# Patient Record
Sex: Female | Born: 1970
Health system: Southern US, Community
[De-identification: ages and names within clinical notes are randomized; demographics above are authoritative.]

## PROBLEM LIST (undated history)

## (undated) DIAGNOSIS — Z8619 Personal history of other infectious and parasitic diseases: Secondary | ICD-10-CM

## (undated) DIAGNOSIS — G4730 Sleep apnea, unspecified: Secondary | ICD-10-CM

## (undated) DIAGNOSIS — M1990 Unspecified osteoarthritis, unspecified site: Secondary | ICD-10-CM

## (undated) DIAGNOSIS — L309 Dermatitis, unspecified: Secondary | ICD-10-CM

## (undated) DIAGNOSIS — I10 Essential (primary) hypertension: Secondary | ICD-10-CM

## (undated) HISTORY — DX: Dermatitis, unspecified: L30.9

## (undated) HISTORY — DX: Sleep apnea, unspecified: G47.30

## (undated) HISTORY — DX: Personal history of other infectious and parasitic diseases: Z86.19

## (undated) HISTORY — DX: Unspecified osteoarthritis, unspecified site: M19.90

## (undated) HISTORY — DX: Essential (primary) hypertension: I10

---

## 2005-06-13 ENCOUNTER — Other Ambulatory Visit: Admission: RE | Admit: 2005-06-13 | Discharge: 2005-06-13 | Payer: Self-pay | Admitting: Gynecology

## 2007-05-08 HISTORY — PX: CHOLECYSTECTOMY: SHX55

## 2007-05-08 HISTORY — PX: COLONOSCOPY: SHX174

## 2012-05-07 DIAGNOSIS — L309 Dermatitis, unspecified: Secondary | ICD-10-CM

## 2012-05-07 HISTORY — DX: Dermatitis, unspecified: L30.9

## 2013-02-18 ENCOUNTER — Emergency Department (HOSPITAL_BASED_OUTPATIENT_CLINIC_OR_DEPARTMENT_OTHER)
Admission: EM | Admit: 2013-02-18 | Discharge: 2013-02-18 | Disposition: A | Discharge status: Home / Self Care | Payer: Self-pay | Attending: Emergency Medicine | Admitting: Emergency Medicine

## 2013-02-18 ENCOUNTER — Encounter (HOSPITAL_BASED_OUTPATIENT_CLINIC_OR_DEPARTMENT_OTHER): Payer: Self-pay | Admitting: Emergency Medicine

## 2013-02-18 DIAGNOSIS — L03039 Cellulitis of unspecified toe: Secondary | ICD-10-CM | POA: Insufficient documentation

## 2013-02-18 DIAGNOSIS — IMO0002 Reserved for concepts with insufficient information to code with codable children: Secondary | ICD-10-CM | POA: Insufficient documentation

## 2013-02-18 DIAGNOSIS — L309 Dermatitis, unspecified: Secondary | ICD-10-CM

## 2013-02-18 DIAGNOSIS — L259 Unspecified contact dermatitis, unspecified cause: Secondary | ICD-10-CM | POA: Insufficient documentation

## 2013-02-18 DIAGNOSIS — L02619 Cutaneous abscess of unspecified foot: Secondary | ICD-10-CM | POA: Insufficient documentation

## 2013-02-18 DIAGNOSIS — L0390 Cellulitis, unspecified: Secondary | ICD-10-CM

## 2013-02-18 MED ORDER — HYDROXYZINE HCL 25 MG PO TABS: 25.0000 mg | ORAL_TABLET | Freq: Four times a day (QID) | ORAL | Status: DC | PRN

## 2013-02-18 MED ORDER — PREDNISONE 20 MG PO TABS: ORAL_TABLET | ORAL | Status: DC

## 2013-02-18 MED ORDER — CEPHALEXIN 250 MG PO CAPS
500.0000 mg | ORAL_CAPSULE | Freq: Once | ORAL | Status: AC
  Administered 2013-02-18: 500 mg via ORAL
  Filled 2013-02-18: qty 2

## 2013-02-18 MED ORDER — CEPHALEXIN 500 MG PO CAPS: 500.0000 mg | ORAL_CAPSULE | Freq: Four times a day (QID) | ORAL | Status: DC

## 2013-02-18 MED ORDER — PREDNISONE 50 MG PO TABS
60.0000 mg | ORAL_TABLET | Freq: Once | ORAL | Status: AC
  Administered 2013-02-18: 20:00:00 60 mg via ORAL
  Filled 2013-02-18 (×2): qty 1

## 2013-02-18 NOTE — ED Notes (Signed)
Pt has red, raised rash to arms, feet, legs, and torso with itching and scabbing.

## 2013-02-18 NOTE — ED Provider Notes (Signed)
CSN: [id number]     Arrival date & time 02/18/13  1910 History   First MD Initiated Contact with Patient 02/18/13 1919     Chief Complaint  Patient presents with  . Rash   (Consider location/radiation/quality/duration/timing/severity/associated sxs/prior Treatment) Patient is a 42 y.o. female presenting with rash.  Rash  Pt with no significant PMH reports she has had mild dry, flaky rash on palms of hands for the last several weeks, but in the last 2 weeks has spread to include soles of her feet, wrists, elbows, legs and arms. Some on her chest, but mostly spares her truck. She reports severe itching, not improved with benadryl, has been using peroxide to keep it clean. No fever but today has not felt well.    History reviewed. No pertinent past medical history. Past Surgical History  Procedure Laterality Date  . Cholecystectomy     History reviewed. No pertinent family history. History  Substance Use Topics  . Smoking status: Never Smoker   . Smokeless tobacco: Not on file  . Alcohol Use: No   OB History   Grav Para Term Preterm Abortions TAB SAB Ect Mult Living                 Review of Systems  Skin: Positive for rash.   All other systems reviewed and are negative except as noted in HPI.   Allergies  Review of patient's allergies indicates no known allergies.  Home Medications  No current outpatient prescriptions on file. BP 157/94  Pulse 98  Temp(Src) 98.7 F (37.1 C) (Oral)  Resp 20  SpO2 100% Physical Exam  Nursing note and vitals reviewed. Constitutional: She is oriented to person, place, and time. She appears well-developed and well-nourished.  HENT:  Head: Normocephalic and atraumatic.  Eyes: EOM are normal. Pupils are equal, round, and reactive to light.  Neck: Normal range of motion. Neck supple.  Cardiovascular: Normal rate, normal heart sounds and intact distal pulses.   Pulmonary/Chest: Effort normal and breath sounds normal.  Abdominal: Bowel  sounds are normal. She exhibits no distension. There is no tenderness.  Musculoskeletal: Normal range of motion. She exhibits no edema and no tenderness.  Neurological: She is alert and oriented to person, place, and time. She has normal strength. No cranial nerve deficit or sensory deficit.  Skin: Skin is warm and dry. Rash noted.  There are multiple areas of red scaly rash on palms of hands, soles of feet, toes, fingers, posterior elbows as well as antecubital and popliteal spaces; does not fluoresce under Wood's lamp  Psychiatric: She has a normal mood and affect.    ED Course  Procedures (including critical care time) Labs Review Labs Reviewed - No data to display Imaging Review No results found.  EKG Interpretation   None       MDM    Pt with itchy rash that has features of both psoriasis as well as eczema. She has had eczema before but not to this degree. She likely has some secondary cellulitis as well, particularly on toes and arms. She has appointment with Dermatology scheduled in 9 days. Will start oral prednisone, Vistaril for itching and Keflex for cellulitis.    Charles B. Bernette Mayers, MD 02/18/13 1946

## 2013-07-31 ENCOUNTER — Emergency Department (HOSPITAL_BASED_OUTPATIENT_CLINIC_OR_DEPARTMENT_OTHER)
Admission: EM | Admit: 2013-07-31 | Discharge: 2013-07-31 | Disposition: A | Discharge status: Home / Self Care | Payer: Self-pay | Attending: Emergency Medicine | Admitting: Emergency Medicine

## 2013-07-31 ENCOUNTER — Encounter (HOSPITAL_BASED_OUTPATIENT_CLINIC_OR_DEPARTMENT_OTHER): Payer: Self-pay | Admitting: Emergency Medicine

## 2013-07-31 DIAGNOSIS — Z792 Long term (current) use of antibiotics: Secondary | ICD-10-CM | POA: Insufficient documentation

## 2013-07-31 DIAGNOSIS — R21 Rash and other nonspecific skin eruption: Secondary | ICD-10-CM | POA: Insufficient documentation

## 2013-07-31 MED ORDER — PREDNISONE 10 MG PO TABS: 20.0000 mg | ORAL_TABLET | Freq: Every day | ORAL | Status: DC

## 2013-07-31 NOTE — ED Provider Notes (Signed)
CSN: [id number]     Arrival date & time 07/31/13  0957 History   First MD Initiated Contact with Patient 07/31/13 1017     Chief Complaint  Patient presents with  . Rash     (Consider location/radiation/quality/duration/timing/severity/associated sxs/prior Treatment) Patient is a 43 y.o. female presenting with rash. The history is provided by the patient.  Rash  patient here after being sent home from work and do to a chronic rash that she has had since November of last year. She notes that for the past 2 weeks she has noted increased pruritus that spread to her extremities. She is scheduled to see a dermatologist in 2 weeks. Denies any fever. Has used over-the-counter medications without relief. She is unsure of environmental exposures. Symptoms are persistent. She is also noted mild swelling without change in her urination. Denies any involvement within her oral mucosa. No involvement of her face. Rash started on her hands.  History reviewed. No pertinent past medical history. Past Surgical History  Procedure Laterality Date  . Cholecystectomy     History reviewed. No pertinent family history. History  Substance Use Topics  . Smoking status: Never Smoker   . Smokeless tobacco: Not on file  . Alcohol Use: No   OB History   Grav Para Term Preterm Abortions TAB SAB Ect Mult Living                 Review of Systems  Skin: Positive for rash.  All other systems reviewed and are negative.      Allergies  Review of patient's allergies indicates no known allergies.  Home Medications   Current Outpatient Rx  Name  Route  Sig  Dispense  Refill  . cephALEXin (KEFLEX) 500 MG capsule   Oral   Take 1 capsule (500 mg total) by mouth 4 (four) times daily.   28 capsule   0   . hydrOXYzine (ATARAX/VISTARIL) 25 MG tablet   Oral   Take 1 tablet (25 mg total) by mouth every 6 (six) hours as needed for itching.   12 tablet   0   . predniSONE (DELTASONE) 20 MG tablet      3 Tabs  PO Days 1-3, then 2 tabs PO Days 4-6, then 1 tab PO Day 7-9, then Half Tab PO Day 10-12   20 tablet   0    BP 156/101  Pulse 96  Temp(Src) 98.3 F (36.8 C) (Oral)  Resp 20  Ht 5\' 4"  (1.626 m)  Wt 205 lb (92.987 kg)  BMI 35.17 kg/m2  SpO2 100% Physical Exam  Nursing note and vitals reviewed. Constitutional: She is oriented to person, place, and time. She appears well-developed and well-nourished.  Non-toxic appearance. No distress.  HENT:  Head: Normocephalic and atraumatic.  Eyes: Conjunctivae, EOM and lids are normal. Pupils are equal, round, and reactive to light.  Neck: Normal range of motion. Neck supple. No tracheal deviation present. No mass present.  Cardiovascular: Normal rate, regular rhythm and normal heart sounds.  Exam reveals no gallop.   No murmur heard. Pulmonary/Chest: Effort normal and breath sounds normal. No stridor. No respiratory distress. She has no decreased breath sounds. She has no wheezes. She has no rhonchi. She has no rales.  Abdominal: Soft. Normal appearance and bowel sounds are normal. She exhibits no distension. There is no tenderness. There is no rebound and no CVA tenderness.  Musculoskeletal: Normal range of motion. She exhibits no edema and no tenderness.  Neurological: She is  alert and oriented to person, place, and time. She has normal strength. No cranial nerve deficit or sensory deficit. GCS eye subscore is 4. GCS verbal subscore is 5. GCS motor subscore is 6.  Skin: Skin is warm and dry. Rash noted. No abrasion noted.  Diffuse eczematous rash noted on her hands as well as her extremities. No evidence of superinfection. Rash is macular in some areas. No oral mucosal involvement. Lips are normal. No petechiae. No purpura.  Psychiatric: She has a normal mood and affect. Her speech is normal and behavior is normal.    ED Course  Procedures (including critical care time) Labs Review Labs Reviewed - No data to display Imaging Review No results  found.   EKG Interpretation None      MDM   Final diagnoses:  None    Patient with likely contact dermatitis versus eczema. Will place patient on course of prednisone since she was on the last time it worked in she was encouraged to keep her appointment with her dermatologist    Toy Baker, MD 07/31/13 1032

## 2013-07-31 NOTE — ED Notes (Signed)
Pt states she has been dealing with what she thinks is eczema since November after moving here . It took her until April to get appt with dermatologist . Pt states in the past few days she has been getting much worse and has severe itching with it.

## 2013-07-31 NOTE — Discharge Instructions (Signed)

## 2014-07-19 ENCOUNTER — Telehealth: Payer: Self-pay | Admitting: *Deleted

## 2014-07-19 NOTE — Telephone Encounter (Signed)
Patient unavailable at time of Pre-Visit Call Left message for patient to return call when available.   

## 2014-07-20 ENCOUNTER — Encounter: Payer: Self-pay | Admitting: *Deleted

## 2014-07-20 ENCOUNTER — Telehealth: Payer: Self-pay | Admitting: *Deleted

## 2014-07-20 NOTE — Telephone Encounter (Signed)
Pre-Visit Call completed with patient and chart updated.   Pre-Visit Info documented in Specialty Comments under [employer].    

## 2014-07-21 ENCOUNTER — Ambulatory Visit (INDEPENDENT_AMBULATORY_CARE_PROVIDER_SITE_OTHER): Payer: BLUE CROSS/BLUE SHIELD | Admitting: Physician Assistant

## 2014-07-21 ENCOUNTER — Encounter: Payer: Self-pay | Admitting: Physician Assistant

## 2014-07-21 VITALS — BP 128/86 | HR 87 | Temp 98.2°F | Resp 16 | Ht 64.0 in | Wt 226.5 lb

## 2014-07-21 DIAGNOSIS — Z1239 Encounter for other screening for malignant neoplasm of breast: Secondary | ICD-10-CM

## 2014-07-21 DIAGNOSIS — R21 Rash and other nonspecific skin eruption: Secondary | ICD-10-CM | POA: Insufficient documentation

## 2014-07-21 DIAGNOSIS — E041 Nontoxic single thyroid nodule: Secondary | ICD-10-CM | POA: Insufficient documentation

## 2014-07-21 LAB — CBC WITH DIFFERENTIAL/PLATELET
BASOS ABS: 0 10*3/uL (ref 0.0–0.1)
Basophils Relative: 0.3 % (ref 0.0–3.0)
EOS ABS: 0.3 10*3/uL (ref 0.0–0.7)
Eosinophils Relative: 3 % (ref 0.0–5.0)
HCT: 40.5 % (ref 36.0–46.0)
HEMOGLOBIN: 13.6 g/dL (ref 12.0–15.0)
LYMPHS PCT: 24.2 % (ref 12.0–46.0)
Lymphs Abs: 2.3 10*3/uL (ref 0.7–4.0)
MCHC: 33.4 g/dL (ref 30.0–36.0)
MCV: 85.8 fl (ref 78.0–100.0)
MONO ABS: 0.5 10*3/uL (ref 0.1–1.0)
Monocytes Relative: 5 % (ref 3.0–12.0)
Neutro Abs: 6.3 10*3/uL (ref 1.4–7.7)
Neutrophils Relative %: 67.5 % (ref 43.0–77.0)
PLATELETS: 322 10*3/uL (ref 150.0–400.0)
RBC: 4.72 Mil/uL (ref 3.87–5.11)
RDW: 14.1 % (ref 11.5–15.5)
WBC: 9.4 10*3/uL (ref 4.0–10.5)

## 2014-07-21 LAB — BASIC METABOLIC PANEL
BUN: 14 mg/dL (ref 6–23)
CHLORIDE: 104 meq/L (ref 96–112)
CO2: 29 meq/L (ref 19–32)
Calcium: 8.9 mg/dL (ref 8.4–10.5)
Creatinine, Ser: 0.79 mg/dL (ref 0.40–1.20)
GFR: 83.55 mL/min (ref >60.00)
Glucose, Bld: 100 mg/dL — ABNORMAL HIGH (ref 70–99)
Potassium: 3.9 mEq/L (ref 3.5–5.1)
SODIUM: 137 meq/L (ref 135–145)

## 2014-07-21 LAB — SEDIMENTATION RATE: Sed Rate: 16 mm/hr (ref 0–22)

## 2014-07-21 LAB — RHEUMATOID FACTOR: Rhuematoid fact SerPl-aCnc: <10 IU/mL (ref <=14)

## 2014-07-21 NOTE — Progress Notes (Signed)
Pre visit review using our clinic review tool, if applicable. No additional management support is needed unless otherwise documented below in the visit note/SLS  

## 2014-07-21 NOTE — Assessment & Plan Note (Signed)
Nonpalpable on exam. Patient is overdue for repeat imaging. We'll order a thyroid ultrasound to further assess.

## 2014-07-21 NOTE — Patient Instructions (Signed)
Please stop by the lab for blood work. I will call you with the results. I am concerned that your rash is not a typical eczema and bears further evaluation.  Continue your Atarax as directed for age. Follow-up with dermatology as scheduled.  You'll be contacted to schedule an ultrasound of the thyroid to reassess the nodule. You also be contacted to schedule a screening mammogram. Please follow-up with me chart earliest convenience for complete physical exam.  Welcome to [facility]!

## 2014-07-21 NOTE — Assessment & Plan Note (Addendum)
Order for screening mammogram placed.  

## 2014-07-21 NOTE — Progress Notes (Signed)
Patient presents to clinic today to establish care.  Acute Concerns: Patient complains of chronic skin eruption, previously diagnosed as atopic dermatitis. States she has flareups of this issue that landed her in the hospital. Is unhappy with her diagnosis of eczema, and is wondering if she should see a rheumatologist or an allergist. Has never had lab workup to assess autoimmune causes of rash.  Chronic Issues: Thyroid nodule --patient endorses thyroid nodule first noted in 2008. Was previously followed by endocrinology who was monitoring with ultrasound. Last ultrasound in 2014 per patient.  Patient denies palpable mass, dyspnea or dysphagia. Denies history of abnormal thyroid function.  Health Maintenance: Dental -- up-to-date  Vision -- up-to-date  Immunizations -- flu shot up-to-date. Declines tetanus today. Mammogram --overdue. We'll order screening mammogram  Past Medical History  Diagnosis Date  . Eczema 2014  . History of chicken pox     Past Surgical History  Procedure Laterality Date  . Cholecystectomy  2009  . Colonoscopy  2009    Current Outpatient Prescriptions on File Prior to Visit  Medication Sig Dispense Refill  . Halcinonide 0.1 % CREA Apply 1 application topically as needed.    . hydrOXYzine (ATARAX/VISTARIL) 25 MG tablet Take 1 tablet (25 mg total) by mouth every 6 (six) hours as needed for itching. 12 tablet 0   No current facility-administered medications on file prior to visit.    Allergies  Allergen Reactions  . Adhesive [Tape] Rash    Eczema Rash    Family History  Problem Relation Age of Onset  . Aneurysm Maternal Aunt   . Aneurysm Maternal Grandmother   . Arthritis Mother     Living  . Heart attack Father     Living  . Healthy Brother   . Healthy Son   . Eczema Son     History   Social History  . Marital Status: Married    Spouse Name: N/A  . Number of Children: N/A  . Years of Education: N/A   Occupational History  . Not  on file.   Social History Main Topics  . Smoking status: Never Smoker   . Smokeless tobacco: Not on file  . Alcohol Use: No  . Drug Use: No  . Sexual Activity: Not on file   Other Topics Concern  . Not on file   Social History Narrative   ROS Pertinent review of systems are listed in the history of present illness.  BP 128/86 mmHg  Pulse 87  Temp(Src) 98.2 F (36.8 C) (Oral)  Resp 16  Ht 5\' 4"  (1.626 m)  Wt 226 lb 8 oz (102.74 kg)  BMI 38.86 kg/m2  SpO2 99%  LMP 07/07/2014  Physical Exam  Constitutional: She is oriented to person, place, and time and well-developed, well-nourished, and in no distress.  HENT:  Head: Normocephalic and atraumatic.  Eyes: Conjunctivae are normal.  Neck: Neck supple. No thyromegaly present.  Cardiovascular: Normal rate, regular rhythm, normal heart sounds and intact distal pulses.   Pulmonary/Chest: Effort normal and breath sounds normal. No respiratory distress. She has no wheezes. She has no rales. She exhibits no tenderness.  Lymphadenopathy:    She has no cervical adenopathy.  Neurological: She is alert and oriented to person, place, and time. No cranial nerve deficit.  Skin: Skin is warm and dry.  Significant eczematous rash covering torso, upper extremities and lower extremities bilaterally. More concentrated on palmar aspect of hands.  Psychiatric: Affect normal.  Vitals reviewed.  Assessment/Plan: Thyroid nodule Nonpalpable on exam. Patient is overdue for repeat imaging. We'll order a thyroid ultrasound to further assess.   Skin eruption Previously diagnosed as eczema and atopic dermatitis. Intermittent flares. I do think it's reasonable to do a further workup. We'll order CBC with differential, BMP, sedimentation rate and ANA.  We'll likely have to refer to either allergy or rheumatology for further assessment. Patient encouraged to continue her medications prescribed by dermatology. Follow-up with specialist as  scheduled.   Breast cancer screening Order for screening mammogram placed.

## 2014-07-21 NOTE — Assessment & Plan Note (Signed)
Previously diagnosed as eczema and atopic dermatitis. Intermittent flares. I do think it's reasonable to do a further workup. We'll order CBC with differential, BMP, sedimentation rate and ANA.  We'll likely have to refer to either allergy or rheumatology for further assessment. Patient encouraged to continue her medications prescribed by dermatology. Follow-up with specialist as scheduled.

## 2014-07-22 LAB — ANA: Anti Nuclear Antibody(ANA): NEGATIVE

## 2014-07-23 ENCOUNTER — Telehealth: Payer: Self-pay | Admitting: Physician Assistant

## 2014-07-23 DIAGNOSIS — R21 Rash and other nonspecific skin eruption: Secondary | ICD-10-CM

## 2014-07-23 NOTE — Telephone Encounter (Signed)
Referral placed.

## 2014-07-23 NOTE — Telephone Encounter (Signed)
-----   Message from Eustace Quail, RMA sent at 07/23/2014  8:19 AM EDT ----- Pt agrees to see a allergist.

## 2014-07-26 ENCOUNTER — Ambulatory Visit (HOSPITAL_BASED_OUTPATIENT_CLINIC_OR_DEPARTMENT_OTHER)
Admission: RE | Admit: 2014-07-26 | Discharge: 2014-07-26 | Disposition: A | Discharge status: Home / Self Care | Payer: BLUE CROSS/BLUE SHIELD | Source: Ambulatory Visit | Attending: Physician Assistant | Admitting: Physician Assistant

## 2014-07-26 DIAGNOSIS — E041 Nontoxic single thyroid nodule: Secondary | ICD-10-CM

## 2014-07-26 DIAGNOSIS — E042 Nontoxic multinodular goiter: Secondary | ICD-10-CM | POA: Insufficient documentation

## 2014-07-29 NOTE — Progress Notes (Signed)
Spoke with patient and advised per recommendations.   Patient will get her old records for comparison.

## 2014-09-28 ENCOUNTER — Telehealth: Payer: Self-pay | Admitting: Physician Assistant

## 2014-09-28 NOTE — Telephone Encounter (Signed)
Spoke with the pt and informed her that the US showed -Thyroid nodules are small in size. Will need her old records so that I can compare size of nodules now versus last [place]. She can bring this in at her earliest convenience. No concerning findings.  Pt verbalized understanding, and stated that she spoke with someone in imaging and they informed her that her old records could not be found.  Verbally informed Selena Batten of this and he would like to repeat the [place] in 6 mos.  Called and lmom @ 4:53pm @ [phone]) informing the pt of this and asked the pt to give me a call back on Wed to let me know she received the message.//AB/CMA

## 2014-09-28 NOTE — Telephone Encounter (Signed)
Relation to HU:DJSH  Call back number: [phone]   Reason for call:  Pt inquiring about ultra sound results taken 07/26/2014, pt received a bill and does not recall every receiving results. Please advise

## 2014-09-29 ENCOUNTER — Institutional Professional Consult (permissible substitution): Payer: BLUE CROSS/BLUE SHIELD | Admitting: Internal Medicine

## 2014-09-29 NOTE — Telephone Encounter (Signed)
Spoke with the pt and she stated that she received the message and agreed to having the [place] repeated in 6 mos.//AB/CMA

## 2015-05-11 MED FILL — TRIAMCINOLONE 0.1% CREAM: 0.1 | 30 days supply | Qty: 454 | Fill #2

## 2015-05-11 MED FILL — hydrOXYzine HCL 10 MG TABS: 10 | 30 days supply | Qty: 90 | Fill #0

## 2015-05-11 MED FILL — FOLIC ACID 1 MG TABLET: 1 | 30 days supply | Qty: 30 | Fill #3

## 2015-05-11 MED FILL — METHOTREXATE 2.5 MG TABLET: 2.5 | 28 days supply | Qty: 20 | Fill #4

## 2015-05-30 ENCOUNTER — Encounter: Payer: Self-pay | Admitting: *Deleted

## 2015-05-30 ENCOUNTER — Telehealth: Payer: Self-pay | Admitting: *Deleted

## 2015-05-30 NOTE — Telephone Encounter (Signed)
Pre-Visit Call completed with patient and chart updated.   Pre-Visit Info documented in Specialty Comments under [employer].    

## 2015-05-30 NOTE — Telephone Encounter (Signed)
Spoke w/ pt's spouse who stated he would have her call back.

## 2015-05-30 NOTE — Telephone Encounter (Signed)
Opened in error

## 2015-05-30 NOTE — Addendum Note (Signed)
Addended by: Starla Link on: 05/30/2015 03:22 PM   Modules accepted: Medications

## 2015-05-31 ENCOUNTER — Encounter: Payer: Self-pay | Admitting: Physician Assistant

## 2015-05-31 ENCOUNTER — Ambulatory Visit (INDEPENDENT_AMBULATORY_CARE_PROVIDER_SITE_OTHER): Payer: BLUE CROSS/BLUE SHIELD | Admitting: Physician Assistant

## 2015-05-31 VITALS — BP 128/88 | HR 98 | Temp 98.2°F | Ht 64.0 in | Wt 225.2 lb

## 2015-05-31 DIAGNOSIS — M25551 Pain in right hip: Secondary | ICD-10-CM

## 2015-05-31 DIAGNOSIS — Z0000 Encounter for general adult medical examination without abnormal findings: Secondary | ICD-10-CM

## 2015-05-31 DIAGNOSIS — G8929 Other chronic pain: Secondary | ICD-10-CM | POA: Insufficient documentation

## 2015-05-31 DIAGNOSIS — Z1239 Encounter for other screening for malignant neoplasm of breast: Secondary | ICD-10-CM | POA: Diagnosis not present

## 2015-05-31 DIAGNOSIS — M25562 Pain in left knee: Secondary | ICD-10-CM

## 2015-05-31 DIAGNOSIS — M25569 Pain in unspecified knee: Secondary | ICD-10-CM

## 2015-05-31 DIAGNOSIS — M25559 Pain in unspecified hip: Secondary | ICD-10-CM

## 2015-05-31 DIAGNOSIS — M25561 Pain in right knee: Secondary | ICD-10-CM

## 2015-05-31 DIAGNOSIS — M25552 Pain in left hip: Secondary | ICD-10-CM

## 2015-05-31 LAB — LIPID PANEL
CHOLESTEROL: 159 mg/dL (ref 0–200)
HDL: 53.7 mg/dL (ref >39.00)
LDL Cholesterol: 91 mg/dL (ref 0–99)
NonHDL: 105.6
Total CHOL/HDL Ratio: 3
Triglycerides: 71 mg/dL (ref 0.0–149.0)
VLDL: 14.2 mg/dL (ref 0.0–40.0)

## 2015-05-31 LAB — COMPREHENSIVE METABOLIC PANEL
ALK PHOS: 77 U/L (ref 39–117)
ALT: 20 U/L (ref 0–35)
AST: 17 U/L (ref 0–37)
Albumin: 4.1 g/dL (ref 3.5–5.2)
BUN: 12 mg/dL (ref 6–23)
CO2: 29 meq/L (ref 19–32)
Calcium: 8.9 mg/dL (ref 8.4–10.5)
Chloride: 104 mEq/L (ref 96–112)
Creatinine, Ser: 0.7 mg/dL (ref 0.40–1.20)
GFR: 95.7 mL/min (ref >60.00)
Glucose, Bld: 107 mg/dL — ABNORMAL HIGH (ref 70–99)
POTASSIUM: 4.2 meq/L (ref 3.5–5.1)
Sodium: 138 mEq/L (ref 135–145)
Total Bilirubin: 0.5 mg/dL (ref 0.2–1.2)
Total Protein: 7.4 g/dL (ref 6.0–8.3)

## 2015-05-31 LAB — CBC
HEMATOCRIT: 39 % (ref 36.0–46.0)
HEMOGLOBIN: 12.7 g/dL (ref 12.0–15.0)
MCHC: 32.5 g/dL (ref 30.0–36.0)
MCV: 88.6 fl (ref 78.0–100.0)
Platelets: 296 10*3/uL (ref 150.0–400.0)
RBC: 4.4 Mil/uL (ref 3.87–5.11)
RDW: 14.4 % (ref 11.5–15.5)
WBC: 8.4 10*3/uL (ref 4.0–10.5)

## 2015-05-31 LAB — URINALYSIS, ROUTINE W REFLEX MICROSCOPIC
Bilirubin Urine: NEGATIVE
Hgb urine dipstick: NEGATIVE
KETONES UR: NEGATIVE
Leukocytes, UA: NEGATIVE
NITRITE: NEGATIVE
RBC / HPF: NONE SEEN (ref 0–?)
SPECIFIC GRAVITY, URINE: 1.025 (ref 1.000–1.030)
TOTAL PROTEIN, URINE-UPE24: NEGATIVE
URINE GLUCOSE: NEGATIVE
UROBILINOGEN UA: 0.2 (ref 0.0–1.0)
pH: 5.5 (ref 5.0–8.0)

## 2015-05-31 LAB — HEMOGLOBIN A1C: Hgb A1c MFr Bld: 5.6 % (ref 4.6–6.5)

## 2015-05-31 LAB — VITAMIN D 25 HYDROXY (VIT D DEFICIENCY, FRACTURES): VITD: 16.44 ng/mL — AB (ref 30.00–100.00)

## 2015-05-31 LAB — TSH: TSH: 1.41 u[IU]/mL (ref 0.35–4.50)

## 2015-05-31 NOTE — Assessment & Plan Note (Signed)
Order for screening mammogram placed.  

## 2015-05-31 NOTE — Progress Notes (Signed)
Pre visit review using our clinic review tool, if applicable. No additional management support is needed unless otherwise documented below in the visit note. 

## 2015-05-31 NOTE — Assessment & Plan Note (Signed)
Discussed ambulation while at work and daily exercise. Light stretches recommended. Tylenol arthritis daily. Follow-up if symptoms are not improving.

## 2015-05-31 NOTE — Assessment & Plan Note (Signed)
Depression screen negative. Health Maintenance reviewed -- Flu shot up-to-date. PAP up-to-date. Will schedule screening mammogram. Is going to check on TDaP administration. Feels she had at work. Preventive schedule discussed and handout given in AVS. Will obtain fasting labs today.

## 2015-05-31 NOTE — Patient Instructions (Addendum)
Please go to the lab for blood work. I will call with your results. You will be contacted to schedule a screening mammogram.   Please try to get the desk you were mentioning so that you can stand more at work, as this helps your symptoms. Try to walk daily after work. Do some light stretches. Take a tylenol Arthritis daily before work with food. Let me know if symptoms are not improving.  Preventive Care for Adults, Female A healthy lifestyle and preventive care can promote health and wellness. Preventive health guidelines for women include the following key practices.  A routine yearly physical is a good way to check with your health care provider about your health and preventive screening. It is a chance to share any concerns and updates on your health and to receive a thorough exam.  Visit your dentist for a routine exam and preventive care every 6 months. Brush your teeth twice a day and floss once a day. Good oral hygiene prevents tooth decay and gum disease.  The frequency of eye exams is based on your age, health, family medical history, use of contact lenses, and other factors. Follow your health care provider's recommendations for frequency of eye exams.  Eat a healthy diet. Foods like vegetables, fruits, whole grains, low-fat dairy products, and lean protein foods contain the nutrients you need without too many calories. Decrease your intake of foods high in solid fats, added sugars, and salt. Eat the right amount of calories for you.Get information about a proper diet from your health care provider, if necessary.  Regular physical exercise is one of the most important things you can do for your health. Most adults should get at least 150 minutes of moderate-intensity exercise (any activity that increases your heart rate and causes you to sweat) each week. In addition, most adults need muscle-strengthening exercises on 2 or more days a week.  Maintain a healthy weight. The body mass  index (BMI) is a screening tool to identify possible weight problems. It provides an estimate of body fat based on height and weight. Your health care provider can find your BMI and can help you achieve or maintain a healthy weight.For adults 20 years and older:  A BMI below 18.5 is considered underweight.  A BMI of 18.5 to 24.9 is normal.  A BMI of 25 to 29.9 is considered overweight.  A BMI of 30 and above is considered obese.  Maintain normal blood lipids and cholesterol levels by exercising and minimizing your intake of saturated fat. Eat a balanced diet with plenty of fruit and vegetables. Blood tests for lipids and cholesterol should begin at age 19 and be repeated every 5 years. If your lipid or cholesterol levels are high, you are over 50, or you are at high risk for heart disease, you may need your cholesterol levels checked more frequently.Ongoing high lipid and cholesterol levels should be treated with medicines if diet and exercise are not working.  If you smoke, find out from your health care provider how to quit. If you do not use tobacco, do not start.  Lung cancer screening is recommended for adults aged 53-80 years who are at high risk for developing lung cancer because of a history of smoking. A yearly low-dose CT scan of the lungs is recommended for people who have at least a 30-pack-year history of smoking and are a current smoker or have quit within the past 15 years. A pack year of smoking is smoking an average  of 1 pack of cigarettes a day for 1 year (for example: 1 pack a day for 30 years or 2 packs a day for 15 years). Yearly screening should continue until the smoker has stopped smoking for at least 15 years. Yearly screening should be stopped for people who develop a health problem that would prevent them from having lung cancer treatment.  If you are pregnant, do not drink alcohol. If you are breastfeeding, be very cautious about drinking alcohol. If you are not pregnant  and choose to drink alcohol, do not have more than 1 drink per day. One drink is considered to be 12 ounces (355 mL) of beer, 5 ounces (148 mL) of wine, or 1.5 ounces (44 mL) of liquor.  Avoid use of street drugs. Do not share needles with anyone. Ask for help if you need support or instructions about stopping the use of drugs.  High blood pressure causes heart disease and increases the risk of stroke. Your blood pressure should be checked at least every 1 to 2 years. Ongoing high blood pressure should be treated with medicines if weight loss and exercise do not work.  If you are 49-63 years old, ask your health care provider if you should take aspirin to prevent strokes.  Diabetes screening is done by taking a blood sample to check your blood glucose level after you have not eaten for a certain period of time (fasting). If you are not overweight and you do not have risk factors for diabetes, you should be screened once every 3 years starting at age 101. If you are overweight or obese and you are 20-95 years of age, you should be screened for diabetes every year as part of your cardiovascular risk assessment.  Breast cancer screening is essential preventive care for women. You should practice "breast self-awareness." This means understanding the normal appearance and feel of your breasts and may include breast self-examination. Any changes detected, no matter how small, should be reported to a health care provider. Women in their 72s and 30s should have a clinical breast exam (CBE) by a health care provider as part of a regular health exam every 1 to 3 years. After age 22, women should have a CBE every year. Starting at age 18, women should consider having a mammogram (breast X-ray test) every year. Women who have a family history of breast cancer should talk to their health care provider about genetic screening. Women at a high risk of breast cancer should talk to their health care providers about having  an MRI and a mammogram every year.  Breast cancer gene (BRCA)-related cancer risk assessment is recommended for women who have family members with BRCA-related cancers. BRCA-related cancers include breast, ovarian, tubal, and peritoneal cancers. Having family members with these cancers may be associated with an increased risk for harmful changes (mutations) in the breast cancer genes BRCA1 and BRCA2. Results of the assessment will determine the need for genetic counseling and BRCA1 and BRCA2 testing.  Your health care provider may recommend that you be screened regularly for cancer of the pelvic organs (ovaries, uterus, and vagina). This screening involves a pelvic examination, including checking for microscopic changes to the surface of your cervix (Pap test). You may be encouraged to have this screening done every 3 years, beginning at age 3.  For women ages 62-65, health care providers may recommend pelvic exams and Pap testing every 3 years, or they may recommend the Pap and pelvic exam, combined with testing  for human papilloma virus (HPV), every 5 years. Some types of HPV increase your risk of cervical cancer. Testing for HPV may also be done on women of any age with unclear Pap test results.  Other health care providers may not recommend any screening for nonpregnant women who are considered low risk for pelvic cancer and who do not have symptoms. Ask your health care provider if a screening pelvic exam is right for you.  If you have had past treatment for cervical cancer or a condition that could lead to cancer, you need Pap tests and screening for cancer for at least 20 years after your treatment. If Pap tests have been discontinued, your risk factors (such as having a new sexual partner) need to be reassessed to determine if screening should resume. Some women have medical problems that increase the chance of getting cervical cancer. In these cases, your health care provider may recommend more  frequent screening and Pap tests.  Colorectal cancer can be detected and often prevented. Most routine colorectal cancer screening begins at the age of 36 years and continues through age 38 years. However, your health care provider may recommend screening at an earlier age if you have risk factors for colon cancer. On a yearly basis, your health care provider may provide home test kits to check for hidden blood in the stool. Use of a small camera at the end of a tube, to directly examine the colon (sigmoidoscopy or colonoscopy), can detect the earliest forms of colorectal cancer. Talk to your health care provider about this at age 62, when routine screening begins. Direct exam of the colon should be repeated every 5-10 years through age 82 years, unless early forms of precancerous polyps or small growths are found.  People who are at an increased risk for hepatitis B should be screened for this virus. You are considered at high risk for hepatitis B if:  You were born in a country where hepatitis B occurs often. Talk with your health care provider about which countries are considered high risk.  Your parents were born in a high-risk country and you have not received a shot to protect against hepatitis B (hepatitis B vaccine).  You have HIV or AIDS.  You use needles to inject street drugs.  You live with, or have sex with, someone who has hepatitis B.  You get hemodialysis treatment.  You take certain medicines for conditions like cancer, organ transplantation, and autoimmune conditions.  Hepatitis C blood testing is recommended for all people born from [birth year] through 1965 and any individual with known risks for hepatitis C.  Practice safe sex. Use condoms and avoid high-risk sexual practices to reduce the spread of sexually transmitted infections (STIs). STIs include gonorrhea, chlamydia, syphilis, trichomonas, herpes, HPV, and human immunodeficiency virus (HIV). Herpes, HIV, and HPV are viral  illnesses that have no cure. They can result in disability, cancer, and death.  You should be screened for sexually transmitted illnesses (STIs) including gonorrhea and chlamydia if:  You are sexually active and are younger than 24 years.  You are older than 24 years and your health care provider tells you that you are at risk for this type of infection.  Your sexual activity has changed since you were last screened and you are at an increased risk for chlamydia or gonorrhea. Ask your health care provider if you are at risk.  If you are at risk of being infected with HIV, it is recommended that you take a  prescription medicine daily to prevent HIV infection. This is called preexposure prophylaxis (PrEP). You are considered at risk if:  You are sexually active and do not regularly use condoms or know the HIV status of your partner(s).  You take drugs by injection.  You are sexually active with a partner who has HIV.  Talk with your health care provider about whether you are at high risk of being infected with HIV. If you choose to begin PrEP, you should first be tested for HIV. You should then be tested every 3 months for as long as you are taking PrEP.  Osteoporosis is a disease in which the bones lose minerals and strength with aging. This can result in serious bone fractures or breaks. The risk of osteoporosis can be identified using a bone density scan. Women ages 68 years and over and women at risk for fractures or osteoporosis should discuss screening with their health care providers. Ask your health care provider whether you should take a calcium supplement or vitamin D to reduce the rate of osteoporosis.  Menopause can be associated with physical symptoms and risks. Hormone replacement therapy is available to decrease symptoms and risks. You should talk to your health care provider about whether hormone replacement therapy is right for you.  Use sunscreen. Apply sunscreen liberally and  repeatedly throughout the day. You should seek shade when your shadow is shorter than you. Protect yourself by wearing long sleeves, pants, a wide-brimmed hat, and sunglasses year round, whenever you are outdoors.  Once a month, do a whole body skin exam, using a mirror to look at the skin on your back. Tell your health care provider of new moles, moles that have irregular borders, moles that are larger than a pencil eraser, or moles that have changed in shape or color.  Stay current with required vaccines (immunizations).  Influenza vaccine. All adults should be immunized every year.  Tetanus, diphtheria, and acellular pertussis (Td, Tdap) vaccine. Pregnant women should receive 1 dose of Tdap vaccine during each pregnancy. The dose should be obtained regardless of the length of time since the last dose. Immunization is preferred during the 27th-36th week of gestation. An adult who has not previously received Tdap or who does not know her vaccine status should receive 1 dose of Tdap. This initial dose should be followed by tetanus and diphtheria toxoids (Td) booster doses every 10 years. Adults with an unknown or incomplete history of completing a 3-dose immunization series with Td-containing vaccines should begin or complete a primary immunization series including a Tdap dose. Adults should receive a Td booster every 10 years.  Varicella vaccine. An adult without evidence of immunity to varicella should receive 2 doses or a second dose if she has previously received 1 dose. Pregnant females who do not have evidence of immunity should receive the first dose after pregnancy. This first dose should be obtained before leaving the health care facility. The second dose should be obtained 4-8 weeks after the first dose.  Human papillomavirus (HPV) vaccine. Females aged 13-26 years who have not received the vaccine previously should obtain the 3-dose series. The vaccine is not recommended for use in pregnant  females. However, pregnancy testing is not needed before receiving a dose. If a female is found to be pregnant after receiving a dose, no treatment is needed. In that case, the remaining doses should be delayed until after the pregnancy. Immunization is recommended for any person with an immunocompromised condition through the age of  26 years if she did not get any or all doses earlier. During the 3-dose series, the second dose should be obtained 4-8 weeks after the first dose. The third dose should be obtained 24 weeks after the first dose and 16 weeks after the second dose.  Zoster vaccine. One dose is recommended for adults aged 64 years or older unless certain conditions are present.  Measles, mumps, and rubella (MMR) vaccine. Adults born before [birth year] generally are considered immune to measles and mumps. Adults born in [birth year] or later should have 1 or more doses of MMR vaccine unless there is a contraindication to the vaccine or there is laboratory evidence of immunity to each of the three diseases. A routine second dose of MMR vaccine should be obtained at least 28 days after the first dose for students attending postsecondary schools, health care workers, or international travelers. People who received inactivated measles vaccine or an unknown type of measles vaccine during 1963-1967 should receive 2 doses of MMR vaccine. People who received inactivated mumps vaccine or an unknown type of mumps vaccine before 1979 and are at high risk for mumps infection should consider immunization with 2 doses of MMR vaccine. For females of childbearing age, rubella immunity should be determined. If there is no evidence of immunity, females who are not pregnant should be vaccinated. If there is no evidence of immunity, females who are pregnant should delay immunization until after pregnancy. Unvaccinated health care workers born before [birth year] who lack laboratory evidence of measles, mumps, or rubella immunity or laboratory  confirmation of disease should consider measles and mumps immunization with 2 doses of MMR vaccine or rubella immunization with 1 dose of MMR vaccine.  Pneumococcal 13-valent conjugate (PCV13) vaccine. When indicated, a person who is uncertain of his immunization history and has no record of immunization should receive the PCV13 vaccine. All adults 63 years of age and older should receive this vaccine. An adult aged 19 years or older who has certain medical conditions and has not been previously immunized should receive 1 dose of PCV13 vaccine. This PCV13 should be followed with a dose of pneumococcal polysaccharide (PPSV23) vaccine. Adults who are at high risk for pneumococcal disease should obtain the PPSV23 vaccine at least 8 weeks after the dose of PCV13 vaccine. Adults older than 47 years of age who have normal immune system function should obtain the PPSV23 vaccine dose at least 1 year after the dose of PCV13 vaccine.  Pneumococcal polysaccharide (PPSV23) vaccine. When PCV13 is also indicated, PCV13 should be obtained first. All adults aged 61 years and older should be immunized. An adult younger than age 68 years who has certain medical conditions should be immunized. Any person who resides in a nursing home or long-term care facility should be immunized. An adult smoker should be immunized. People with an immunocompromised condition and certain other conditions should receive both PCV13 and PPSV23 vaccines. People with human immunodeficiency virus (HIV) infection should be immunized as soon as possible after diagnosis. Immunization during chemotherapy or radiation therapy should be avoided. Routine use of PPSV23 vaccine is not recommended for American Indians, [place] Natives, or people younger than 65 years unless there are medical conditions that require PPSV23 vaccine. When indicated, people who have unknown immunization and have no record of immunization should receive PPSV23 vaccine. One-time  revaccination 5 years after the first dose of PPSV23 is recommended for people aged 19-64 years who have chronic kidney failure, nephrotic syndrome, asplenia, or immunocompromised conditions. People who received  1-2 doses of PPSV23 before age 9 years should receive another dose of PPSV23 vaccine at age 25 years or later if at least 5 years have passed since the previous dose. Doses of PPSV23 are not needed for people immunized with PPSV23 at or after age 55 years.  Meningococcal vaccine. Adults with asplenia or persistent complement component deficiencies should receive 2 doses of quadrivalent meningococcal conjugate (MenACWY-D) vaccine. The doses should be obtained at least 2 months apart. Microbiologists working with certain meningococcal bacteria, [place] recruits, people at risk during an outbreak, and people who travel to or live in countries with a high rate of meningitis should be immunized. A first-year college student up through age 31 years who is living in a residence hall should receive a dose if she did not receive a dose on or after her 16th birthday. Adults who have certain high-risk conditions should receive one or more doses of vaccine.  Hepatitis A vaccine. Adults who wish to be protected from this disease, have certain high-risk conditions, work with hepatitis A-infected animals, work in hepatitis A research labs, or travel to or work in countries with a high rate of hepatitis A should be immunized. Adults who were previously unvaccinated and who anticipate close contact with an international adoptee during the first 60 days after arrival in the [place] from a country with a high rate of hepatitis A should be immunized.  Hepatitis B vaccine. Adults who wish to be protected from this disease, have certain high-risk conditions, may be exposed to blood or other infectious body fluids, are household contacts or sex partners of hepatitis B positive people, are clients or workers in  certain care facilities, or travel to or work in countries with a high rate of hepatitis B should be immunized.  Haemophilus influenzae type b (Hib) vaccine. A previously unvaccinated person with asplenia or sickle cell disease or having a scheduled splenectomy should receive 1 dose of Hib vaccine. Regardless of previous immunization, a recipient of a hematopoietic stem cell transplant should receive a 3-dose series 6-12 months after her successful transplant. Hib vaccine is not recommended for adults with HIV infection. Preventive Services / Frequency Ages 41 to 50 years  Blood pressure check.** / Every 3-5 years.  Lipid and cholesterol check.** / Every 5 years beginning at age 35.  Clinical breast exam.** / Every 3 years for women in their 53s and 81s.  BRCA-related cancer risk assessment.** / For women who have family members with a BRCA-related cancer (breast, ovarian, tubal, or peritoneal cancers).  Pap test.** / Every 2 years from ages 63 through 43. Every 3 years starting at age 15 through age 36 or 9 with a history of 3 consecutive normal Pap tests.  HPV screening.** / Every 3 years from ages 61 through ages 83 to 35 with a history of 3 consecutive normal Pap tests.  Hepatitis C blood test.** / For any individual with known risks for hepatitis C.  Skin self-exam. / Monthly.  Influenza vaccine. / Every year.  Tetanus, diphtheria, and acellular pertussis (Tdap, Td) vaccine.** / Consult your health care provider. Pregnant women should receive 1 dose of Tdap vaccine during each pregnancy. 1 dose of Td every 10 years.  Varicella vaccine.** / Consult your health care provider. Pregnant females who do not have evidence of immunity should receive the first dose after pregnancy.  HPV vaccine. / 3 doses over 6 months, if 39 and younger. The vaccine is not recommended for use in pregnant females.  However, pregnancy testing is not needed before receiving a dose.  Measles, mumps, rubella  (MMR) vaccine.** / You need at least 1 dose of MMR if you were born in 1957 or later. You may also need a 2nd dose. For females of childbearing age, rubella immunity should be determined. If there is no evidence of immunity, females who are not pregnant should be vaccinated. If there is no evidence of immunity, females who are pregnant should delay immunization until after pregnancy.  Pneumococcal 13-valent conjugate (PCV13) vaccine.** / Consult your health care provider.  Pneumococcal polysaccharide (PPSV23) vaccine.** / 1 to 2 doses if you smoke cigarettes or if you have certain conditions.  Meningococcal vaccine.** / 1 dose if you are age 22 to 43 years and a Market researcher living in a residence hall, or have one of several medical conditions, you need to get vaccinated against meningococcal disease. You may also need additional booster doses.  Hepatitis A vaccine.** / Consult your health care provider.  Hepatitis B vaccine.** / Consult your health care provider.  Haemophilus influenzae type b (Hib) vaccine.** / Consult your health care provider. Ages 4 to 43 years  Blood pressure check.** / Every year.  Lipid and cholesterol check.** / Every 5 years beginning at age 28 years.  Lung cancer screening. / Every year if you are aged 22-80 years and have a 30-pack-year history of smoking and currently smoke or have quit within the past 15 years. Yearly screening is stopped once you have quit smoking for at least 15 years or develop a health problem that would prevent you from having lung cancer treatment.  Clinical breast exam.** / Every year after age 67 years.  BRCA-related cancer risk assessment.** / For women who have family members with a BRCA-related cancer (breast, ovarian, tubal, or peritoneal cancers).  Mammogram.** / Every year beginning at age 67 years and continuing for as long as you are in good health. Consult with your health care provider.  Pap test.** / Every 3  years starting at age 40 years through age 9 or 41 years with a history of 3 consecutive normal Pap tests.  HPV screening.** / Every 3 years from ages 35 years through ages 3 to 34 years with a history of 3 consecutive normal Pap tests.  Fecal occult blood test (FOBT) of stool. / Every year beginning at age 83 years and continuing until age 58 years. You may not need to do this test if you get a colonoscopy every 10 years.  Flexible sigmoidoscopy or colonoscopy.** / Every 5 years for a flexible sigmoidoscopy or every 10 years for a colonoscopy beginning at age 60 years and continuing until age 31 years.  Hepatitis C blood test.** / For all people born from [birth year] through 1965 and any individual with known risks for hepatitis C.  Skin self-exam. / Monthly.  Influenza vaccine. / Every year.  Tetanus, diphtheria, and acellular pertussis (Tdap/Td) vaccine.** / Consult your health care provider. Pregnant women should receive 1 dose of Tdap vaccine during each pregnancy. 1 dose of Td every 10 years.  Varicella vaccine.** / Consult your health care provider. Pregnant females who do not have evidence of immunity should receive the first dose after pregnancy.  Zoster vaccine.** / 1 dose for adults aged 71 years or older.  Measles, mumps, rubella (MMR) vaccine.** / You need at least 1 dose of MMR if you were born in 1957 or later. You may also need a second dose. For females of  childbearing age, rubella immunity should be determined. If there is no evidence of immunity, females who are not pregnant should be vaccinated. If there is no evidence of immunity, females who are pregnant should delay immunization until after pregnancy.  Pneumococcal 13-valent conjugate (PCV13) vaccine.** / Consult your health care provider.  Pneumococcal polysaccharide (PPSV23) vaccine.** / 1 to 2 doses if you smoke cigarettes or if you have certain conditions.  Meningococcal vaccine.** / Consult your health care  provider.  Hepatitis A vaccine.** / Consult your health care provider.  Hepatitis B vaccine.** / Consult your health care provider.  Haemophilus influenzae type b (Hib) vaccine.** / Consult your health care provider. Ages 65 years and over  Blood pressure check.** / Every year.  Lipid and cholesterol check.** / Every 5 years beginning at age 28 years.  Lung cancer screening. / Every year if you are aged 95-80 years and have a 30-pack-year history of smoking and currently smoke or have quit within the past 15 years. Yearly screening is stopped once you have quit smoking for at least 15 years or develop a health problem that would prevent you from having lung cancer treatment.  Clinical breast exam.** / Every year after age 77 years.  BRCA-related cancer risk assessment.** / For women who have family members with a BRCA-related cancer (breast, ovarian, tubal, or peritoneal cancers).  Mammogram.** / Every year beginning at age 54 years and continuing for as long as you are in good health. Consult with your health care provider.  Pap test.** / Every 3 years starting at age 68 years through age 29 or 64 years with 3 consecutive normal Pap tests. Testing can be stopped between 65 and 70 years with 3 consecutive normal Pap tests and no abnormal Pap or HPV tests in the past 10 years.  HPV screening.** / Every 3 years from ages 98 years through ages 56 or 84 years with a history of 3 consecutive normal Pap tests. Testing can be stopped between 65 and 70 years with 3 consecutive normal Pap tests and no abnormal Pap or HPV tests in the past 10 years.  Fecal occult blood test (FOBT) of stool. / Every year beginning at age 1 years and continuing until age 27 years. You may not need to do this test if you get a colonoscopy every 10 years.  Flexible sigmoidoscopy or colonoscopy.** / Every 5 years for a flexible sigmoidoscopy or every 10 years for a colonoscopy beginning at age 38 years and continuing  until age 47 years.  Hepatitis C blood test.** / For all people born from [birth year] through 1967 and any individual with known risks for hepatitis C.  Osteoporosis screening.** / A one-time screening for women ages 8 years and over and women at risk for fractures or osteoporosis.  Skin self-exam. / Monthly.  Influenza vaccine. / Every year.  Tetanus, diphtheria, and acellular pertussis (Tdap/Td) vaccine.** / 1 dose of Td every 10 years.  Varicella vaccine.** / Consult your health care provider.  Zoster vaccine.** / 1 dose for adults aged 68 years or older.  Pneumococcal 13-valent conjugate (PCV13) vaccine.** / Consult your health care provider.  Pneumococcal polysaccharide (PPSV23) vaccine.** / 1 dose for all adults aged 44 years and older.  Meningococcal vaccine.** / Consult your health care provider.  Hepatitis A vaccine.** / Consult your health care provider.  Hepatitis B vaccine.** / Consult your health care provider.  Haemophilus influenzae type b (Hib) vaccine.** / Consult your health care provider. ** Family history  and personal history of risk and conditions may change your health care provider's recommendations.   This information is not intended to replace advice given to you by your health care provider. Make sure you discuss any questions you have with your health care provider.   Document Released: 06/19/2001 Document Revised: 05/14/2014 Document Reviewed: 09/18/2010 Elsevier Interactive Patient Education [employer].

## 2015-05-31 NOTE — Progress Notes (Signed)
Patient presents to clinic today for annual exam.  Patient is fasting for labs.  Acute Concerns: Patient c/o pain in knees and hips bilaterally over the past several months. Denies radiation of pain. Denies trauma or injury. Endorses some stiffness and worsened pain with prolonged sitting. Is improved with ambulation. Has not taken anything for pain. Endorses weight gain due to sedentary job.  Health Maintenance: Immunizations -- Flu shot up-to-date.  Mammogram -- PAP -- up-to-date. Followed by gynecology.  Past Medical History  Diagnosis Date  . Eczema 2014  . History of chicken pox     Past Surgical History  Procedure Laterality Date  . Cholecystectomy  2009  . Colonoscopy  2009    No current outpatient prescriptions on file prior to visit.   No current facility-administered medications on file prior to visit.    Allergies  Allergen Reactions  . Adhesive [Tape] Rash    Eczema Rash    Family History  Problem Relation Age of Onset  . Aneurysm Maternal Aunt   . Aneurysm Maternal Grandmother   . Arthritis Mother     Living  . Heart attack Father     Living  . Healthy Brother   . Healthy Son   . Eczema Son     Social History   Social History  . Marital Status: Married    Spouse Name: N/A  . Number of Children: N/A  . Years of Education: N/A   Occupational History  . Not on file.   Social History Main Topics  . Smoking status: Never Smoker   . Smokeless tobacco: Not on file  . Alcohol Use: No  . Drug Use: No  . Sexual Activity: Not on file   Other Topics Concern  . Not on file   Social History Narrative   Review of Systems  Constitutional: Negative for fever and weight loss.  HENT: Negative for ear discharge, ear pain, hearing loss and tinnitus.   Eyes: Negative for blurred vision, double vision, photophobia and pain.  Respiratory: Negative for cough and shortness of breath.   Cardiovascular: Negative for chest pain and palpitations.    Gastrointestinal: Negative for heartburn, nausea, vomiting, abdominal pain, diarrhea, constipation, blood in stool and melena.  Genitourinary: Negative for dysuria, urgency, frequency, hematuria and flank pain.  Musculoskeletal: Positive for joint pain. Negative for falls.  Neurological: Negative for dizziness, loss of consciousness and headaches.  Endo/Heme/Allergies: Negative for environmental allergies.  Psychiatric/Behavioral: Negative for depression, suicidal ideas, hallucinations and substance abuse. The patient is not nervous/anxious and does not have insomnia.    BP 128/88 mmHg  Pulse 98  Temp(Src) 98.2 F (36.8 C) (Oral)  Ht 5\' 4"  (1.626 m)  Wt 225 lb 4 oz (102.173 kg)  BMI 38.65 kg/m2  SpO2 97%  LMP 05/16/2015  Physical Exam  Constitutional: She is oriented to person, place, and time and well-developed, well-nourished, and in no distress.  HENT:  Head: Normocephalic and atraumatic.  Right Ear: Tympanic membrane, external ear and ear canal normal.  Left Ear: Tympanic membrane, external ear and ear canal normal.  Nose: Nose normal. No mucosal edema.  Mouth/Throat: Uvula is midline, oropharynx is clear and moist and mucous membranes are normal. No oropharyngeal exudate or posterior oropharyngeal erythema.  Eyes: Conjunctivae are normal. Pupils are equal, round, and reactive to light.  Neck: Neck supple. No thyromegaly present.  Cardiovascular: Normal rate, regular rhythm, normal heart sounds and intact distal pulses.   Pulmonary/Chest: Effort normal and breath sounds  normal. No respiratory distress. She has no wheezes. She has no rales.  Abdominal: Soft. Bowel sounds are normal. She exhibits no distension and no mass. There is no tenderness. There is no rebound and no guarding.  Musculoskeletal: Normal range of motion.  Lymphadenopathy:    She has no cervical adenopathy.  Neurological: She is alert and oriented to person, place, and time. No cranial nerve deficit.  Skin:  Skin is warm and dry. No rash noted.  Psychiatric: Affect normal.  Vitals reviewed.   No results found for this or any previous visit (from the past 2160 hour(s)).  Assessment/Plan: Breast cancer screening Order for screening mammogram placed.  Visit for preventive health examination Depression screen negative. Health Maintenance reviewed -- Flu shot up-to-date. PAP up-to-date. Will schedule screening mammogram. Is going to check on TDaP administration. Feels she had at work. Preventive schedule discussed and handout given in AVS. Will obtain fasting labs today.   Chronic arthralgias of knees and hips Discussed ambulation while at work and daily exercise. Light stretches recommended. Tylenol arthritis daily. Follow-up if symptoms are not improving.

## 2015-06-01 ENCOUNTER — Other Ambulatory Visit: Payer: Self-pay | Admitting: Physician Assistant

## 2015-06-01 DIAGNOSIS — E559 Vitamin D deficiency, unspecified: Secondary | ICD-10-CM

## 2015-06-01 MED ORDER — VITAMIN D (ERGOCALCIFEROL) 1.25 MG (50000 UNIT) PO CAPS: 50000.0000 [IU] | ORAL_CAPSULE | ORAL | Status: DC

## 2015-06-13 ENCOUNTER — Telehealth: Payer: Self-pay | Admitting: Physician Assistant

## 2015-06-13 MED FILL — METHOTREXATE 2.5 MG TABLET: 2.5 | 28 days supply | Qty: 20 | Fill #0

## 2015-06-13 NOTE — Telephone Encounter (Signed)
Caller name: Rainbow  Relationship to patient: Self   Can be reached: [phone]  Reason for call: pt says she would like to have her most recent labs faxed over to her dermatologist office to prevent her from having to go to there location and repeat labs. She would like to have them faxed to:    Fax: [facility]  Attn : Justice Rocher [phone]

## 2015-06-13 NOTE — Telephone Encounter (Addendum)
Faxed recent lab results to [facility]-Attn:[place] @ [phone]).  Confirmation received.//AB/CMA

## 2015-06-22 ENCOUNTER — Telehealth: Payer: Self-pay | Admitting: Physician Assistant

## 2015-06-22 NOTE — Telephone Encounter (Signed)
Relation to XI:PJAS Call back number:[phone]   Reason for call:  Patient experiencing flu like symptoms and her son has been diagnosed with the flu therefore she is home and cant make an appointment patient in need of clinical advice. Please advise

## 2015-06-22 NOTE — Telephone Encounter (Signed)
Please call patient to assess symptoms and when they started.

## 2015-06-23 NOTE — Telephone Encounter (Signed)
Nurse spoke with patient who has elected to schedule appt.

## 2015-07-12 MED FILL — METHOTREXATE 2.5 MG TABLET: 2.5 | 28 days supply | Qty: 25 | Fill #0

## 2015-07-12 MED FILL — VIT D2 1.25 MG (50,000 UNIT: 1.25 MG | 28 days supply | Qty: 4 | Fill #0

## 2015-08-05 ENCOUNTER — Other Ambulatory Visit: Payer: Self-pay

## 2015-08-09 MED FILL — METHOTREXATE 2.5 MG TABLET: 2.5 | 28 days supply | Qty: 25 | Fill #1

## 2015-08-15 MED FILL — VIT D2 1.25 MG (50,000 UNIT: 1.25 MG | 28 days supply | Qty: 4 | Fill #1

## 2015-09-23 MED FILL — TRIAMCINOLONE 0.1% CREAM: 0.1 | 30 days supply | Qty: 454 | Fill #3

## 2015-09-23 MED FILL — METHOTREXATE 2.5 MG TABLET: 2.5 | 28 days supply | Qty: 25 | Fill #2

## 2015-11-03 MED FILL — FOLIC ACID 1 MG TABLET: 1 | 30 days supply | Qty: 30 | Fill #0

## 2015-11-03 MED FILL — METHOTREXATE 2.5 MG TABLET: 2.5 | 28 days supply | Qty: 25 | Fill #3

## 2015-11-29 MED FILL — FOLIC ACID 1 MG TABLET: 1 | 30 days supply | Qty: 30 | Fill #0

## 2015-11-29 MED FILL — METHOTREXATE 2.5 MG TABLET: 2.5 | 28 days supply | Qty: 20 | Fill #0

## 2015-12-28 MED FILL — UREA 40% CREAM: 40 | 30 days supply | Qty: 198 | Fill #0

## 2015-12-28 MED FILL — TRIAMCINOLONE 0.1% CREAM: 0.1 | 30 days supply | Qty: 454 | Fill #0

## 2015-12-28 MED FILL — METHOTREXATE 2.5 MG TABLET: 2.5 | 28 days supply | Qty: 16 | Fill #0

## 2016-02-07 MED FILL — METHOTREXATE 2.5 MG TABLET: 2.5 | 28 days supply | Qty: 16 | Fill #1

## 2016-03-05 MED FILL — METHOTREXATE 2.5 MG TABLET: 2.5 | 28 days supply | Qty: 16 | Fill #2

## 2016-04-04 MED FILL — TRIAMCINOLONE 0.1% CREAM: 0.1 | 30 days supply | Qty: 454 | Fill #1 | Status: TO

## 2016-04-04 MED FILL — METHOTREXATE 2.5 MG TABLET: 2.5 | 28 days supply | Qty: 16 | Fill #3

## 2016-05-11 MED FILL — METHOTREXATE 2.5 MG TABLET: 2.5 | 28 days supply | Qty: 16 | Fill #4

## 2016-06-29 MED FILL — METHOTREXATE 2.5 MG TABLET: 2.5 | 28 days supply | Qty: 20 | Fill #1

## 2016-08-02 MED FILL — UREA 40% CREAM: 40 | 30 days supply | Qty: 198 | Fill #0 | Status: TO

## 2016-08-02 MED FILL — FOLIC ACID 1 MG TABLET: 1 | 30 days supply | Qty: 30 | Fill #0

## 2016-08-02 MED FILL — BETAMETHASONE DP 0.05% CRM: 0.05 | 30 days supply | Qty: 45 | Fill #0 | Status: TO

## 2016-08-02 MED FILL — FLUCONAZOLE 150 MG TABLET: 150 | 7 days supply | Qty: 2 | Fill #0

## 2016-08-02 MED FILL — METHOTREXATE 2.5 MG TABLET: 2.5 | 13 days supply | Qty: 10 | Fill #2

## 2016-08-02 MED FILL — hydrOXYzine HCL 10 MG TABS: 10 | 30 days supply | Qty: 90 | Fill #0

## 2016-08-22 MED FILL — METHOTREXATE 2.5 MG TABLET: 2.5 | 28 days supply | Qty: 16 | Fill #0

## 2016-09-28 MED FILL — METHOTREXATE 2.5 MG TABLET: 2.5 | 28 days supply | Qty: 16 | Fill #1

## 2016-10-31 MED FILL — METHOTREXATE 2.5 MG TABLET: 2.5 | 28 days supply | Qty: 16 | Fill #2

## 2016-12-04 MED FILL — FOLIC ACID 1 MG TABLET: 1 | 30 days supply | Qty: 30 | Fill #1

## 2016-12-04 MED FILL — METHOTREXATE 2.5 MG TABLET: 2.5 | 28 days supply | Qty: 16 | Fill #3

## 2017-08-12 ENCOUNTER — Telehealth: Payer: Self-pay | Admitting: General Practice

## 2017-08-12 NOTE — Telephone Encounter (Signed)
Copied from CRM [id number]. Topic: Quick Communication - See Telephone Encounter >> Aug 12, 2017  5:02 PM Rudi Coco, [place] wrote: CRM for notification. See Telephone encounter for: 08/12/17.  Pt. Calling to have physical done but needs to est. Care with a new provider. Pt. Was a pt. Of Marcelline Mates

## 2017-08-14 NOTE — Telephone Encounter (Signed)
Pt is scheduled with [facility] on 08/22/17 by Floria Raveling.

## 2017-08-22 ENCOUNTER — Encounter: Payer: Self-pay | Admitting: Family Medicine

## 2017-08-22 ENCOUNTER — Ambulatory Visit: Payer: 59 | Admitting: Family Medicine

## 2017-08-22 VITALS — BP 142/100 | HR 103 | Temp 98.7°F | Ht 65.0 in | Wt 243.0 lb

## 2017-08-22 DIAGNOSIS — R030 Elevated blood-pressure reading, without diagnosis of hypertension: Secondary | ICD-10-CM

## 2017-08-22 DIAGNOSIS — B360 Pityriasis versicolor: Secondary | ICD-10-CM

## 2017-08-22 DIAGNOSIS — M7752 Other enthesopathy of left foot: Secondary | ICD-10-CM

## 2017-08-22 DIAGNOSIS — H8110 Benign paroxysmal vertigo, unspecified ear: Secondary | ICD-10-CM

## 2017-08-22 MED ORDER — METHYLPREDNISOLONE 4 MG PO TBPK: ORAL_TABLET | ORAL | 0 refills | Status: DC

## 2017-08-22 MED ORDER — KETOCONAZOLE 2 % EX CREA
1.0000 | TOPICAL_CREAM | Freq: Every day | CUTANEOUS | 1 refills | Status: AC
Start: 2017-08-22 — End: 2017-09-05

## 2017-08-22 MED FILL — METHYLPREDNISOLONE 4 MG TAB: 4 | 6 days supply | Qty: 21 | Fill #0

## 2017-08-22 MED FILL — KETOCONAZOLE 2% CREAM: 2 | 14 days supply | Qty: 60 | Fill #0

## 2017-08-22 NOTE — Patient Instructions (Addendum)
Check your blood pressure at home. 2-3 times per week, write down readings.  Ice/cold pack over area for 10-15 min twice daily.  OK to take Tylenol 1000 mg (2 extra strength tabs) or 975 mg (3 regular strength tabs) every 6 hours as needed.  No NSAIDs while on steroid.   Achilles Tendinitis Rehab It is normal to feel mild stretching, pulling, tightness, or discomfort as you do these exercises, but you should stop right away if you feel sudden pain or your pain gets worse.   Stretching and range of motion exercises These exercises warm up your muscles and joints and improve the movement and flexibility of your ankle. These exercises also help to relieve pain, numbness, and tingling. Exercise A: Standing wall calf stretch, knee straight  1. Stand with your hands against a wall. 2. Extend your affected leg behind you and bend your front knee slightly. Keep both of your heels on the floor. 3. Point the toes of your back foot slightly inward. 4. Keeping your heels on the floor and your back knee straight, shift your weight toward the wall. Do not allow your back to arch. You should feel a gentle stretch in your calf. 5. Hold this position for seconds. Repeat 2 times. Complete this stretch 3 times per week Exercise B: Standing wall calf stretch, knee bent 1. Stand with your hands against a wall. 2. Extend your affected leg behind you, and bend your front knee slightly. Keep both of your heels on the floor. 3. Point the toes of your back foot slightly inward. 4. Keeping your heels on the floor, unlock your back knee so that it is bent. You should feel a gentle stretch deep in your calf. 5. Hold this position for 30 seconds. Repeat 2 times. Complete this stretch 3 times per week.  Strengthening exercises These exercises build strength and control of your ankle. Endurance is the ability to use your muscles for a long time, even after they get tired. Exercise C: Plantar flexion with  band  1. Sit on the floor with your affected leg extended. You may put a pillow under your calf to give your foot more room to move. 2. Loop a rubber exercise band or tube around the ball of your affected foot. The ball of your foot is on the walking surface, right under your toes. The band or tube should be slightly tense when your foot is relaxed. If the band or tube slips, you can put on your shoe or put a washcloth between the band and your foot to help it stay in place. 3. Slowly point your toes downward, pushing them away from you. 4. Hold this position for 10 seconds. 5. Slowly release the tension in the band or tube, controlling smoothly until your foot is back to the starting position. Repeat 2 times. Complete this exercise 3 times per week. Exercise D: Heel raise with eccentric lower  1. Stand on a step with the balls of your feet. The ball of your foot is on the walking surface, right under your toes. ? Do not put your heels on the step. ? For balance, rest your hands on the wall or on a railing. 2. Rise up onto the balls of your feet. 3. Keeping your heels up, shift all of your weight to your affected leg and pick up your other leg. 4. Slowly lower your affected leg so your heel drops below the level of the step. 5. Put down your foot. If  told by your health care provider, build up to:  3 sets of 15 repetitions while keeping your knees straight.  3 sets of 15 repetitions while keeping your knees bent as far as told by your health care provider.  Complete this exercise 3 times per week. If this exercise is too easy, try doing it while wearing a backpack with weights in it. Balance exercises These exercises improve or maintain your balance. Balance is important in preventing falls. Exercise E: Single leg stand 1. Without shoes, stand near a railing or in a door frame. Hold on to the railing or door frame as needed. 2. Stand on your affected foot. Keep your big toe down on the  floor and try to keep your arch lifted. 3. Hold this position for 10 seconds. Repeat 2 times. Complete this exercise 3 times per week. If this exercise is too easy, you can try it with your eyes closed or while standing on a pillow. Make sure you discuss any questions you have with your health care provider. Document Released: 11/22/2004 Document Revised: 12/29/2015 Document Reviewed: 12/28/2014 Elsevier Interactive Patient Education  2018 [employer].  How to Perform the Epley Maneuver The Epley maneuver is an exercise that relieves symptoms of vertigo. Vertigo is the feeling that you or your surroundings are moving when they are not. When you feel vertigo, you may feel like the room is spinning and have trouble walking. Dizziness is a little different than vertigo. When you are dizzy, you may feel unsteady or light-headed. You can do this maneuver at home whenever you have symptoms of vertigo. You can do it up to 3 times a day until your symptoms go away. Even though the Epley maneuver may relieve your vertigo for a few weeks, it is possible that your symptoms will return. This maneuver relieves vertigo, but it does not relieve dizziness. What are the risks? If it is done correctly, the Epley maneuver is considered safe. Sometimes it can lead to dizziness or nausea that goes away after a short time. If you develop other symptoms, such as changes in vision, weakness, or numbness, stop doing the maneuver and call your health care provider. How to perform the Epley maneuver 1. Sit on the edge of a bed or table with your back straight and your legs extended or hanging over the edge of the bed or table. 2. Turn your head halfway toward the affected ear or side. 3. Lie backward quickly with your head turned until you are lying flat on your back. You may want to position a pillow under your shoulders. 4. Hold this position for 30 seconds. You may experience an attack of vertigo. This is  normal. 5. Turn your head to the opposite direction until your unaffected ear is facing the floor. 6. Hold this position for 30 seconds. You may experience an attack of vertigo. This is normal. Hold this position until the vertigo stops. 7. Turn your whole body to the same side as your head. Hold for another 30 seconds. 8. Sit back up. You can repeat this exercise up to 3 times a day. Follow these instructions at home:  After doing the Epley maneuver, you can return to your normal activities.  Ask your health care provider if there is anything you should do at home to prevent vertigo. He or she may recommend that you: ? Keep your head raised (elevated) with two or more pillows while you sleep. ? Do not sleep on the side of  your affected ear. ? Get up slowly from bed. ? Avoid sudden movements during the day. ? Avoid extreme head movement, like looking up or bending over. Contact a health care provider if:  Your vertigo gets worse.  You have other symptoms, including: ? Nausea. ? Vomiting. ? Headache. Get help right away if:  You have vision changes.  You have a severe or worsening headache or neck pain.  You cannot stop vomiting.  You have new numbness or weakness in any part of your body. Summary  Vertigo is the feeling that you or your surroundings are moving when they are not.  The Epley maneuver is an exercise that relieves symptoms of vertigo.  If the Epley maneuver is done correctly, it is considered safe. You can do it up to 3 times a day. This information is not intended to replace advice given to you by your health care provider. Make sure you discuss any questions you have with your health care provider. Document Released: 04/28/2013 Document Revised: 03/13/2016 Document Reviewed: 03/13/2016 Elsevier Interactive Patient Education  2017 [employer].

## 2017-08-22 NOTE — Progress Notes (Signed)
Chief Complaint  Patient presents with  . Establish Care  . Dizziness    Kaitlyn Hancock is 47 y.o. pt here for dizziness.  Duration: 2 months  Lasts for a few seconds. Pass out? No Spinning? Yes Recent illness/fever? No Headache? No Neurologic signs? No  Change in PO intake? No   Arthritis? in both of her feet and ankles. Worse in the AM, gets better throughout the day. Has not tried anything at home so far. Has been going on for a few years and getting worse. No inj or change in activity. No swelling or bruising.   Patient has noticed a new rash on her back.  She sent a message to her dermatologist who thought it could be fungal but wanted her to see her PCP before coming to her.  It itches, no pain or drainage.  She has not tried anything over at home so far.  No sick contacts or new topicals.   She does not have a hx of HTN, but checks intermittently at work. No current medication from this, started having higher bp's once she started gaining weight.   ROS:  10 pt ROS neg unless otherwise noted above.   Past Medical History:  Diagnosis Date  . Eczema 2014  . History of chicken pox     Family History  Problem Relation Age of Onset  . Aneurysm Maternal Aunt   . Aneurysm Maternal Grandmother   . Arthritis Mother        Living  . Heart attack Father        Living  . Healthy Brother   . Healthy Son   . Eczema Son    Past Surgical History:  Procedure Laterality Date  . CHOLECYSTECTOMY  2009  . COLONOSCOPY  2009    Social History   Socioeconomic History  . Marital status: Married    Allergies as of 08/22/2017      Reactions   Adhesive [tape] Rash   Eczema Rash      Medication List        Accurate as of 08/22/17  3:26 PM. Always use your most recent med list.          DUPIXENT 300 MG/2ML Sosy Generic drug:  Dupilumab Inject 300 mg into the skin every 14 (fourteen) days.   ketoconazole 2 % cream Commonly known as:  NIZORAL Apply 1 application topically  daily for 14 days.   methylPREDNISolone 4 MG Tbpk tablet Commonly known as:  MEDROL DOSEPAK Follow instructions on package.   triamcinolone cream 0.1 % Commonly known as:  KENALOG       BP (!) 142/100 (BP Location: Left Arm, Patient Position: Sitting, Cuff Size: Large)   Pulse (!) 103   Temp 98.7 F (37.1 C) (Oral)   Ht 5\' 5"  (1.651 m)   Wt 243 lb (110.2 kg)   SpO2 97%   BMI 40.44 kg/m  General: Awake, alert, appears stated age Eyes: PERRLA, EOMi Ears: Patent, TM's neg b/l Heart: RRR, no murmurs, no carotid bruits Lungs: CTAB, no accessory muscle use MSK: 5/5 strength throughout, gait normal; TTP over calcaneal bursa on L, no other ttp noted over foot or bony landmarks Neuro: No cerebellar signs, patellar reflex 2/4 b/l wo clonus, calcaneal reflex 1/4 b/l wo clonus, biceps reflex 2/4 b/l wo clonus; Dix-Hall-Pike negative b/l. Psych: Age appropriate judgment and insight, normal mood and affect  Left calcaneal bursitis - Plan: methylPREDNISolone (MEDROL DOSEPAK) 4 MG TBPK tablet  Benign paroxysmal positional  vertigo, unspecified laterality  Elevated blood pressure reading  Tinea versicolor - Plan: ketoconazole (NIZORAL) 2 % cream  Orders as above. Ice. Stretches/exercises for achilles. If no improvement, will try a boot. Epley maneuver info given. Ck BPs at home. Use Ketoconazole at home daily for 2 weeks.  F/u prn. Pt voiced understanding and agreement to the plan.  Jilda Roche [place], DO 08/22/17 3:26 PM

## 2017-08-22 NOTE — Progress Notes (Signed)
Pre visit review using our clinic review tool, if applicable. No additional management support is needed unless otherwise documented below in the visit note. 

## 2017-09-24 DIAGNOSIS — Z713 Dietary counseling and surveillance: Secondary | ICD-10-CM | POA: Diagnosis not present

## 2017-09-24 DIAGNOSIS — E669 Obesity, unspecified: Secondary | ICD-10-CM | POA: Diagnosis not present

## 2017-10-02 ENCOUNTER — Ambulatory Visit: Payer: 59 | Admitting: Family Medicine

## 2018-08-22 ENCOUNTER — Encounter: Payer: Self-pay | Admitting: Family Medicine

## 2018-08-22 ENCOUNTER — Other Ambulatory Visit: Payer: Self-pay

## 2018-08-22 ENCOUNTER — Ambulatory Visit (INDEPENDENT_AMBULATORY_CARE_PROVIDER_SITE_OTHER): Payer: 59 | Admitting: Family Medicine

## 2018-08-22 DIAGNOSIS — M542 Cervicalgia: Secondary | ICD-10-CM

## 2018-08-22 MED ORDER — CYCLOBENZAPRINE HCL 10 MG PO TABS: 5.0000 mg | ORAL_TABLET | Freq: Three times a day (TID) | ORAL | 0 refills | Status: DC | PRN

## 2018-08-22 MED ORDER — PREDNISONE 20 MG PO TABS: 40.0000 mg | ORAL_TABLET | Freq: Every day | ORAL | 0 refills | Status: DC

## 2018-08-22 MED FILL — predniSONE 20 MG TABS: 20 | 5 days supply | Qty: 10 | Fill #0

## 2018-08-22 MED FILL — CYCLOBENZAPRINE HCL 10 MG T: 10 | 10 days supply | Qty: 30 | Fill #0

## 2018-08-22 NOTE — Patient Instructions (Signed)
Call and come into office in 1 week if no better.  Don't get pregnant on these medications.  OK to take Tylenol 1000 mg (2 extra strength tabs) or 975 mg (3 regular strength tabs) every 6 hours as needed.  Heat (pad or rice pillow in microwave) over affected area, 10-15 minutes twice daily.   Take Flexeril (cyclobenzaprine) 1-2 hours before planned bedtime. If it makes you drowsy, do not take during the day. You can try half a tab the following night.  EXERCISES RANGE OF MOTION (ROM) AND STRETCHING EXERCISES  These exercises may help you when beginning to rehabilitate your issue. In order to successfully resolve your symptoms, you must improve your posture. These exercises are designed to help reduce the forward-head and rounded-shoulder posture which contributes to this condition. Your symptoms may resolve with or without further involvement from your physician, physical therapist or athletic trainer. While completing these exercises, remember:   Restoring tissue flexibility helps normal motion to return to the joints. This allows healthier, less painful movement and activity.  An effective stretch should be held for at least 20 seconds, although you may need to begin with shorter hold times for comfort.  A stretch should never be painful. You should only feel a gentle lengthening or release in the stretched tissue.  Do not do any stretch or exercise that you cannot tolerate.  STRETCH- Axial Extensors  Lie on your back on the floor. You may bend your knees for comfort. Place a rolled-up hand towel or dish towel, about 2 inches in diameter, under the part of your head that makes contact with the floor.  Gently tuck your chin, as if trying to make a "double chin," until you feel a gentle stretch at the base of your head.  Hold 15-20 seconds. Repeat 2-3 times. Complete this exercise 1 time per day.   STRETCH - Axial Extension   Stand or sit on a firm surface. Assume a good posture:  chest up, shoulders drawn back, abdominal muscles slightly tense, knees unlocked (if standing) and feet hip width apart.  Slowly retract your chin so your head slides back and your chin slightly lowers. Continue to look straight ahead.  You should feel a gentle stretch in the back of your head. Be certain not to feel an aggressive stretch since this can cause headaches later.  Hold for 15-20 seconds. Repeat 2-3 times. Complete this exercise 1 time per day.  STRETCH - Cervical Side Bend   Stand or sit on a firm surface. Assume a good posture: chest up, shoulders drawn back, abdominal muscles slightly tense, knees unlocked (if standing) and feet hip width apart.  Without letting your nose or shoulders move, slowly tip your right / left ear to your shoulder until your feel a gentle stretch in the muscles on the opposite side of your neck.  Hold 15-20 seconds. Repeat 2-3 times. Complete this exercise 1-2 times per day.  STRETCH - Cervical Rotators   Stand or sit on a firm surface. Assume a good posture: chest up, shoulders drawn back, abdominal muscles slightly tense, knees unlocked (if standing) and feet hip width apart.  Keeping your eyes level with the ground, slowly turn your head until you feel a gentle stretch along the back and opposite side of your neck.  Hold 15-20 seconds. Repeat 2-3 times. Complete this exercise 1-2 times per day.  RANGE OF MOTION - Neck Circles   Stand or sit on a firm surface. Assume a good posture:  chest up, shoulders drawn back, abdominal muscles slightly tense, knees unlocked (if standing) and feet hip width apart.  Gently roll your head down and around from the back of one shoulder to the back of the other. The motion should never be forced or painful.  Repeat the motion 10-20 times, or until you feel the neck muscles relax and loosen. Repeat 2-3 times. Complete the exercise 1-2 times per day. STRENGTHENING EXERCISES - Cervical Strain and Sprain These  exercises may help you when beginning to rehabilitate your injury. They may resolve your symptoms with or without further involvement from your physician, physical therapist, or athletic trainer. While completing these exercises, remember:   Muscles can gain both the endurance and the strength needed for everyday activities through controlled exercises.  Complete these exercises as instructed by your physician, physical therapist, or athletic trainer. Progress the resistance and repetitions only as guided.  You may experience muscle soreness or fatigue, but the pain or discomfort you are trying to eliminate should never worsen during these exercises. If this pain does worsen, stop and make certain you are following the directions exactly. If the pain is still present after adjustments, discontinue the exercise until you can discuss the trouble with your clinician.  STRENGTH - Cervical Flexors, Isometric  Face a wall, standing about 6 inches away. Place a small pillow, a ball about 6-8 inches in diameter, or a folded towel between your forehead and the wall.  Slightly tuck your chin and gently push your forehead into the soft object. Push only with mild to moderate intensity, building up tension gradually. Keep your jaw and forehead relaxed.  Hold 10 to 20 seconds. Keep your breathing relaxed.  Release the tension slowly. Relax your neck muscles completely before you start the next repetition. Repeat 2-3 times. Complete this exercise 1 time per day.  STRENGTH- Cervical Lateral Flexors, Isometric   Stand about 6 inches away from a wall. Place a small pillow, a ball about 6-8 inches in diameter, or a folded towel between the side of your head and the wall.  Slightly tuck your chin and gently tilt your head into the soft object. Push only with mild to moderate intensity, building up tension gradually. Keep your jaw and forehead relaxed.  Hold 10 to 20 seconds. Keep your breathing relaxed.   Release the tension slowly. Relax your neck muscles completely before you start the next repetition. Repeat 2-3 times. Complete this exercise 1 time per day.  STRENGTH - Cervical Extensors, Isometric   Stand about 6 inches away from a wall. Place a small pillow, a ball about 6-8 inches in diameter, or a folded towel between the back of your head and the wall.  Slightly tuck your chin and gently tilt your head back into the soft object. Push only with mild to moderate intensity, building up tension gradually. Keep your jaw and forehead relaxed.  Hold 10 to 20 seconds. Keep your breathing relaxed.  Release the tension slowly. Relax your neck muscles completely before you start the next repetition. Repeat 2-3 times. Complete this exercise 1 time per day.  POSTURE AND BODY MECHANICS CONSIDERATIONS Keeping correct posture when sitting, standing or completing your activities will reduce the stress put on different body tissues, allowing injured tissues a chance to heal and limiting painful experiences. The following are general guidelines for improved posture. Your physician or physical therapist will provide you with any instructions specific to your needs. While reading these guidelines, remember:  The exercises prescribed  by your provider will help you have the flexibility and strength to maintain correct postures.  The correct posture provides the optimal environment for your joints to work. All of your joints have less wear and tear when properly supported by a spine with good posture. This means you will experience a healthier, less painful body.  Correct posture must be practiced with all of your activities, especially prolonged sitting and standing. Correct posture is as important when doing repetitive low-stress activities (typing) as it is when doing a single heavy-load activity (lifting).  PROLONGED STANDING WHILE SLIGHTLY LEANING FORWARD When completing a task that requires you to lean  forward while standing in one place for a long time, place either foot up on a stationary 2- to 4-inch high object to help maintain the best posture. When both feet are on the ground, the low back tends to lose its slight inward curve. If this curve flattens (or becomes too large), then the back and your other joints will experience too much stress, fatigue more quickly, and can cause pain.   RESTING POSITIONS Consider which positions are most painful for you when choosing a resting position. If you have pain with flexion-based activities (sitting, bending, stooping, squatting), choose a position that allows you to rest in a less flexed posture. You would want to avoid curling into a fetal position on your side. If your pain worsens with extension-based activities (prolonged standing, working overhead), avoid resting in an extended position such as sleeping on your stomach. Most people will find more comfort when they rest with their spine in a more neutral position, neither too rounded nor too arched. Lying on a non-sagging bed on your side with a pillow between your knees, or on your back with a pillow under your knees will often provide some relief. Keep in mind, being in any one position for a prolonged period of time, no matter how correct your posture, can still lead to stiffness.  WALKING Walk with an upright posture. Your ears, shoulders, and hips should all line up. OFFICE WORK When working at a desk, create an environment that supports good, upright posture. Without extra support, muscles fatigue and lead to excessive strain on joints and other tissues.  CHAIR:  A chair should be able to slide under your desk when your back makes contact with the back of the chair. This allows you to work closely.  The chair's height should allow your eyes to be level with the upper part of your monitor and your hands to be slightly lower than your elbows.  Body position: ? Your feet should make contact  with the floor. If this is not possible, use a foot rest. ? Keep your ears over your shoulders. This will reduce stress on your neck and low back.

## 2018-08-22 NOTE — Progress Notes (Signed)
Musculoskeletal Exam  Patient: Kaitlyn Hancock DOB: 04-25-1971  DOS: 08/22/2018  SUBJECTIVE:  Chief Complaint:   No chief complaint on file.   Kaitlyn Hancock is a 48 y.o.  female for evaluation and treatment of neck pain. Due to outbreak, we are interacting via web portal for an electronic face-to-face visit. I verified patient's ID using 2 identifiers.   Onset:  5 days ago. Woke up and felt she slept on it wrong Location: R side of neck Character:  aching and sharp  Progression of issue:  is unchanged Associated symptoms: numbness/tingling in R hand, weakness in R arm; still able to lift Treatment: to date has been ice, OTC NSAIDS and heat.   Neurovascular symptoms: no  ROS: Musculoskeletal/Extremities: +neck pain  Past Medical History:  Diagnosis Date  . Eczema 2014  . History of chicken pox     Objective: No conversational dyspnea Age appropriate judgment and insight Nml affect and mood  Assessment:  Neck pain - Plan: predniSONE (DELTASONE) 20 MG tablet, cyclobenzaprine (FLEXERIL) 10 MG tablet  Plan: Orders as above. Warnings about Flexeril. Heat, Tylenol, stretches/exercises. If no improvement, I will see her in the office in 1 week. Likely neck strain + nerve sheath inflammation causing weakness. Does not sound like 3/5 strength or worse as she can still raise her arm.  The patient voiced understanding and agreement to the plan.   Jilda Roche Ansley, DO 08/22/18  8:18 AM

## 2018-08-25 ENCOUNTER — Other Ambulatory Visit: Payer: Self-pay | Admitting: Family Medicine

## 2018-08-27 ENCOUNTER — Other Ambulatory Visit: Payer: Self-pay

## 2018-08-27 ENCOUNTER — Encounter: Payer: Self-pay | Admitting: Family Medicine

## 2018-08-27 ENCOUNTER — Ambulatory Visit (INDEPENDENT_AMBULATORY_CARE_PROVIDER_SITE_OTHER): Payer: 59 | Admitting: Family Medicine

## 2018-08-27 DIAGNOSIS — S46811D Strain of other muscles, fascia and tendons at shoulder and upper arm level, right arm, subsequent encounter: Secondary | ICD-10-CM | POA: Diagnosis not present

## 2018-08-27 MED ORDER — GABAPENTIN 400 MG PO CAPS: ORAL_CAPSULE | ORAL | 0 refills | Status: DC

## 2018-08-27 NOTE — Patient Instructions (Signed)
Heat (pad or rice pillow in microwave) over affected area, 10-15 minutes twice daily.   If you do not hear anything about your referral in the next 1-2 weeks, call our office and ask for an update.  Trapezius stretches/exercises Do exercises exactly as told by your health care provider and adjust them as directed. It is normal to feel mild stretching, pulling, tightness, or discomfort as you do these exercises, but you should stop right away if you feel sudden pain or your pain gets worse.  Stretching and range of motion exercises These exercises warm up your muscles and joints and improve the movement and flexibility of your shoulder. These exercises can also help to relieve pain, numbness, and tingling. If you are unable to do any of the following for any reason, do not further attempt to do it.   Exercise A: Flexion, standing    1. Stand and hold a broomstick, a cane, or a similar object. Place your hands a little more than shoulder-width apart on the object. Your left / right hand should be palm-up, and your other hand should be palm-down. 2. Push the stick to raise your left / right arm out to your side and then over your head. Use your other hand to help move the stick. Stop when you feel a stretch in your shoulder, or when you reach the angle that is recommended by your health care provider. ? Avoid shrugging your shoulder while you raise your arm. Keep your shoulder blade tucked down toward your spine. 3. Hold for 30 seconds. 4. Slowly return to the starting position. Repeat 2 times. Complete this exercise 3 times per week.  Exercise B: Abduction, supine    1. Lie on your back and hold a broomstick, a cane, or a similar object. Place your hands a little more than shoulder-width apart on the object. Your left / right hand should be palm-up, and your other hand should be palm-down. 2. Push the stick to raise your left / right arm out to your side and then over your head. Use your  other hand to help move the stick. Stop when you feel a stretch in your shoulder, or when you reach the angle that is recommended by your health care provider. ? Avoid shrugging your shoulder while you raise your arm. Keep your shoulder blade tucked down toward your spine. 3. Hold for 30 seconds. 4. Slowly return to the starting position. Repeat 2 times. Complete this exercise 3 times per week.  Exercise C: Flexion, active-assisted    1. Lie on your back. You may bend your knees for comfort. 2. Hold a broomstick, a cane, or a similar object. Place your hands about shoulder-width apart on the object. Your palms should face toward your feet. 3. Raise the stick and move your arms over your head and behind your head, toward the floor. Use your healthy arm to help your left / right arm move farther. Stop when you feel a gentle stretch in your shoulder, or when you reach the angle where your health care provider tells you to stop. 4. Hold for 30 seconds. 5. Slowly return to the starting position. Repeat 2 times. Complete this exercise 3 times per week.  Exercise D: External rotation and abduction    1. Stand in a door frame with one of your feet slightly in front of the other. This is called a staggered stance. 2. Choose one of the following positions as told by your health care provider: ?  Place your hands and forearms on the door frame above your head. ? Place your hands and forearms on the door frame at the height of your head. ? Place your hands on the door frame at the height of your elbows. 3. Slowly move your weight onto your front foot until you feel a stretch across your chest and in the front of your shoulders. Keep your head and chest upright and keep your abdominal muscles tight. 4. Hold for 30 seconds. 5. To release the stretch, shift your weight to your back foot. Repeat 2 times. Complete this stretch 3 times per week.  Strengthening exercises These exercises build strength  and endurance in your shoulder. Endurance is the ability to use your muscles for a long time, even after your muscles get tired. Exercise E: Scapular depression and adduction  1. Sit on a stable chair. Support your arms in front of you with pillows, armrests, or a tabletop. Keep your elbows in line with the sides of your body. 2. Gently move your shoulder blades down toward your middle back. Relax the muscles on the tops of your shoulders and in the back of your neck. 3. Hold for 3 seconds. 4. Slowly release the tension and relax your muscles completely before doing this exercise again. Repeat for a total of 10 repetitions. 5. After you have practiced this exercise, try doing the exercise without the arm support. Then, try the exercise while standing instead of sitting. Repeat 2 times. Complete this exercise 3 times per week.  Exercise F: Shoulder abduction, isometric    1. Stand or sit about 4-6 inches (10-15 cm) from a wall with your left / right side facing the wall. 2. Bend your left / right elbow and gently press your elbow against the wall. 3. Increase the pressure slowly until you are pressing as hard as you can without shrugging your shoulder. 4. Hold for 3 seconds. 5. Slowly release the tension and relax your muscles completely. Repeat for a total of 10 repetitions. Repeat 2 times. Complete this exercise 3 times per week.  Exercise G: Shoulder flexion, isometric    1. Stand or sit about 4-6 inches (10-15 cm) away from a wall with your left / right side facing the wall. 2. Keep your left / right elbow straight and gently press the top of your fist against the wall. Increase the pressure slowly until you are pressing as hard as you can without shrugging your shoulder. 3. Hold for 10-15 seconds. 4. Slowly release the tension and relax your muscles completely. Repeat for a total of 10 repetitions. Repeat 2 times. Complete this exercise 3 times per week.  Exercise H: Internal  rotation    1. Sit in a stable chair without armrests, or stand. Secure an exercise band at your left / right side, at elbow height. 2. Place a soft object, such as a folded towel or a small pillow, under your left / right upper arm so your elbow is a few inches (about 8 cm) away from your side. 3. Hold the end of the exercise band so the band stretches. 4. Keeping your elbow pressed against the soft object under your arm, move your forearm across your body toward your abdomen. Keep your body steady so the movement is only coming from your shoulder. 5. Hold for 3 seconds. 6. Slowly return to the starting position. Repeat for a total of 10 repetitions. Repeat 2 times. Complete this exercise 3 times per week.  Exercise I:   External rotation    1. Sit in a stable chair without armrests, or stand. 2. Secure an exercise band at your left / right side, at elbow height. 3. Place a soft object, such as a folded towel or a small pillow, under your left / right upper arm so your elbow is a few inches (about 8 cm) away from your side. 4. Hold the end of the exercise band so the band stretches. 5. Keeping your elbow pressed against the soft object under your arm, move your forearm out, away from your abdomen. Keep your body steady so the movement is only coming from your shoulder. 6. Hold for 3 seconds. 7. Slowly return to the starting position. Repeat for a total of 10 repetitions. Repeat 2 times. Complete this exercise 3 times per week. Exercise J: Shoulder extension  1. Sit in a stable chair without armrests, or stand. Secure an exercise band to a stable object in front of you so the band is at shoulder height. 2. Hold one end of the exercise band in each hand. Your palms should face each other. 3. Straighten your elbows and lift your hands up to shoulder height. 4. Step back, away from the secured end of the exercise band, until the band stretches. 5. Squeeze your shoulder blades together and  pull your hands down to the sides of your thighs. Stop when your hands are straight down by your sides. Do not let your hands go behind your body. 6. Hold for 3 seconds. 7. Slowly return to the starting position. Repeat for a total of 10 repetitions. Repeat 2 times. Complete this exercise 3 times per week.  Exercise K: Shoulder extension, prone    1. Lie on your abdomen on a firm surface so your left / right arm hangs over the edge. 2. Hold a 5 lb weight in your hand so your palm faces in toward your body. Your arm should be straight. 3. Squeeze your shoulder blade down toward the middle of your back. 4. Slowly raise your arm behind you, up to the height of the surface that you are lying on. Keep your arm straight. 5. Hold for 3 seconds. 6. Slowly return to the starting position and relax your muscles. Repeat for a total of 10 repetitions. Repeat 2 times. Complete this exercise 3 times per week.   Exercise L: Horizontal abduction, prone  1. Lie on your abdomen on a firm surface so your left / right arm hangs over the edge. 2. Hold a 5 lb weight in your hand so your palm faces toward your feet. Your arm should be straight. 3. Squeeze your shoulder blade down toward the middle of your back. 4. Bend your elbow so your hand moves up, until your elbow is bent to an "L" shape (90 degrees). With your elbow bent, slowly move your forearm forward and up. Raise your hand up to the height of the surface that you are lying on. ? Your upper arm should not move, and your elbow should stay bent. ? At the top of the movement, your palm should face the floor. 5. Hold for 3 seconds. 6. Slowly return to the starting position and relax your muscles. Repeat for a total of 10 repetitions. Repeat 2 times. Complete this exercise 3 times per week.  Exercise M: Horizontal abduction, standing  1. Sit on a stable chair, or stand. 2. Secure an exercise band to a stable object in front of you so the band is at  shoulder height. 3. Hold one end of the exercise band in each hand. 4. Straighten your elbows and lift your hands straight in front of you, up to shoulder height. Your palms should face down, toward the floor. 5. Step back, away from the secured end of the exercise band, until the band stretches. 6. Move your arms out to your sides, and keep your arms straight. 7. Hold for 3 seconds. 8. Slowly return to the starting position. Repeat for a total of 10 repetitions. Repeat 2 times. Complete this exercise 3 times per week.  Exercise N: Scapular retraction and elevation  1. Sit on a stable chair, or stand. 2. Secure an exercise band to a stable object in front of you so the band is at shoulder height. 3. Hold one end of the exercise band in each hand. Your palms should face each other. 4. Sit in a stable chair without armrests, or stand. 5. Step back, away from the secured end of the exercise band, until the band stretches. 6. Squeeze your shoulder blades together and lift your hands over your head. Keep your elbows straight. 7. Hold for 3 seconds. 8. Slowly return to the starting position. Repeat for a total of 10 repetitions. Repeat 2 times. Complete this exercise 3 times per week.  This information is not intended to replace advice given to you by your health care provider. Make sure you discuss any questions you have with your health care provider. Document Released: 04/23/2005 Document Revised: 12/29/2015 Document Reviewed: 03/10/2015 [employer] Interactive Patient Education  2017 [employer]   

## 2018-08-27 NOTE — Progress Notes (Signed)
Musculoskeletal Exam  Patient: Kaitlyn Hancock DOB: Oct 26, 1970  DOS: 08/27/2018  SUBJECTIVE:  Chief Complaint:   Chief Complaint  Patient presents with  . Pain    Kaitlyn Hancock is a 48 y.o.  female for f/u neck pain. Due to outbreak, we are interacting via web portal for an electronic face-to-face visit. I verified patient's ID using 2 identifiers. The initial several attempts at video conversation failed so we resorted to telephone encounter.   Onset:  10 days ago. Had felt she slept on it wrong Location: R side of neck Character:  aching and sharp  Progression of issue:  is unchanged Associated symptoms: numbness/tingling in R hand, weakness in R arm unchanged Treatment: to date has been ice, OTC NSAIDS, oral steroids, stretches/exercises, and muscle relaxers.    ROS: Musculoskeletal/Extremities: +neck pain  Past Medical History:  Diagnosis Date  . Eczema 2014  . History of chicken pox     Objective: No conversational dyspnea Age appropriate judgment and insight Nml affect and mood  Assessment:  Strain of right trapezius muscle, subsequent encounter - Plan: Ambulatory referral to Sports Medicine, gabapentin (NEURONTIN) 400 MG capsule  Plan: States when she pushes on her neck, it does not hurt but when she pushes on her trapezius, it does hurt.  New stretches and exercises for her trapezius will be mailed.  Continue heat.  Continue anti-inflammatories and Tylenol.  Stop muscle relaxant, start Neurontin.  We will refer to sports medicine to ensure nothing else sinister is going on. F/u prn. The patient voiced understanding and agreement to the plan.   Jilda Roche [place], DO 08/27/18  9:25 AM

## 2018-08-28 MED FILL — GABAPENTIN 400 MG CAPS: 400 | 30 days supply | Qty: 60 | Fill #0

## 2018-09-01 ENCOUNTER — Encounter: Payer: Self-pay | Admitting: Family Medicine

## 2018-09-01 ENCOUNTER — Ambulatory Visit: Payer: 59 | Admitting: Family Medicine

## 2018-09-01 ENCOUNTER — Other Ambulatory Visit: Payer: Self-pay

## 2018-09-01 ENCOUNTER — Ambulatory Visit (HOSPITAL_BASED_OUTPATIENT_CLINIC_OR_DEPARTMENT_OTHER)
Admission: RE | Admit: 2018-09-01 | Discharge: 2018-09-01 | Disposition: A | Discharge status: Home / Self Care | Payer: 59 | Source: Ambulatory Visit | Attending: Family Medicine | Admitting: Family Medicine

## 2018-09-01 VITALS — BP 135/92 | Ht 65.0 in | Wt 225.0 lb

## 2018-09-01 DIAGNOSIS — M5412 Radiculopathy, cervical region: Secondary | ICD-10-CM | POA: Diagnosis not present

## 2018-09-01 DIAGNOSIS — M542 Cervicalgia: Secondary | ICD-10-CM | POA: Diagnosis not present

## 2018-09-01 DIAGNOSIS — M50323 Other cervical disc degeneration at C6-C7 level: Secondary | ICD-10-CM | POA: Diagnosis not present

## 2018-09-01 MED ORDER — DICLOFENAC SODIUM 75 MG PO TBEC: 75.0000 mg | DELAYED_RELEASE_TABLET | Freq: Two times a day (BID) | ORAL | 1 refills | Status: DC

## 2018-09-01 MED FILL — DICLOFENAC SODIUM 75 MG TAB: 75 | 30 days supply | Qty: 60 | Fill #0

## 2018-09-01 NOTE — Patient Instructions (Addendum)
You have cervical radiculopathy (a pinched nerve in the neck). Prednisone 6 day dose pack to relieve irritation/inflammation of the nerve. Diclofenac 75mg  twice a day with food for pain and inflammation. Gabapentin 400mg  twice a day for 3 days then increase to three times a day - when running low call me and I'll send in more for you at the three times a day dosing. Topical capsaicin up to 4 times a day will help. Consider salon pas patches as well to help with pain. Simple range of motion exercises within limits of pain to prevent further stiffness. Get x-rays downstairs after you leave today - we will call you with the results and go ahead with MRI of your cervical spine. Consider physical therapy for stretching, exercises, traction, and modalities. Heat 15 minutes at a time 3-4 times a day to help with spasms. Watch head position when on computers, texting, when sleeping in bed - should in line with back to prevent further nerve traction and irritation. Follow up will depend on the MRI results.

## 2018-09-01 NOTE — Progress Notes (Addendum)
PCP: Sharlene Dory, DO  Subjective:   HPI: Patient is a 48 y.o. female here for right neck and arm pain.  Patient states that she woke up 2 weeks ago with neck and right shoulder pain.  She denies any specific injury.  Since that time her pain has progressed to feeling of arm weakness and numbness as well as radiating pain.  Her pain is fairly constant 4/10 and worsens to 10/10 at times.  Is similarly worse when laying flat.  It is more tolerable while standing.  She also notes feeling neck fatigue.  She was seen by her PCP who prescribed her gabapentin 400 mg nightly and a 5-day course of steroids which did not seem to help.  She is also taking Flexeril at night which does help her to sleep. Patient does note generalized weakness in the arm which is caused her to drop objects.  She localizes the numbness and tingling in her hands to the second through fourth digits, primarily affecting the middle finger.  She has no history of neck pain or prior injuries.  No skin changes.  Past Medical History:  Diagnosis Date  . Eczema 2014  . History of chicken pox     Current Outpatient Medications on File Prior to Visit  Medication Sig Dispense Refill  . Dupilumab (DUPIXENT) 300 MG/2ML SOSY Inject 300 mg into the skin every 14 (fourteen) days.    Marland Kitchen gabapentin (NEURONTIN) 400 MG capsule Take 1 capsule in the evening. If it does not make you drowsy, take 1 capsule twice daily. 60 capsule 0  . triamcinolone cream (KENALOG) 0.1 %   5   No current facility-administered medications on file prior to visit.     Past Surgical History:  Procedure Laterality Date  . CHOLECYSTECTOMY  2009  . COLONOSCOPY  2009    Allergies  Allergen Reactions  . Adhesive [Tape] Rash    Eczema Rash    Social History   Socioeconomic History  . Marital status: Married    Spouse name: Not on file  . Number of children: Not on file  . Years of education: Not on file  . Highest education level: Not on file   Occupational History  . Not on file  Social Needs  . Financial resource strain: Not on file  . Food insecurity:    Worry: Not on file    Inability: Not on file  . Transportation needs:    Medical: Not on file    Non-medical: Not on file  Tobacco Use  . Smoking status: Never Smoker  . Smokeless tobacco: Never Used  Substance and Sexual Activity  . Alcohol use: No  . Drug use: No  . Sexual activity: Not on file  Lifestyle  . Physical activity:    Days per week: Not on file    Minutes per session: Not on file  . Stress: Not on file  Relationships  . Social connections:    Talks on phone: Not on file    Gets together: Not on file    Attends religious service: Not on file    Active member of club or organization: Not on file    Attends meetings of clubs or organizations: Not on file    Relationship status: Not on file  . Intimate partner violence:    Fear of current or ex partner: Not on file    Emotionally abused: Not on file    Physically abused: Not on file    Forced  sexual activity: Not on file  Other Topics Concern  . Not on file  Social History Narrative  . Not on file    Family History  Problem Relation Age of Onset  . Aneurysm Maternal Aunt   . Aneurysm Maternal Grandmother   . Arthritis Mother        Living  . Heart attack Father        Living  . Healthy Brother   . Healthy Son   . Eczema Son     Ht 5\' 5"  (1.651 m)   Wt 225 lb (102.1 kg)   BMI 37.44 kg/m   Review of Systems: See HPI above.     Objective:  Physical Exam:  Gen: awake, alert, NAD, comfortable in exam room Pulm: breathing unlabored  Neck: No obvious deformity or skin changes She is somewhat limited in rotation of the cervical spine.  Mild pain with end range of motion. No C-spine tenderness. Decreased sensation to light touch in the C7 dermatome on the right.  Normal sensation on the left 3/5 strength with triceps, 4+/5 strength with wrist flexion.  Strength testing otherwise  normal  Right shoulder: No obvious deformity Full range of motion of the shoulder No tenderness to palpation around the shoulder 5/5 strength with RTC testing Decrease sensation distally in C7 dermatome  Left shoulder: Full range of motion 5/5 strength with RTC testing N/V intact distally   Assessment & Plan:  1.  Cervical radiculopathy-likely involving the C7 nerve root based on distribution of symptoms and her exam. - Cervical x-rays independently reviewed and noted mild arthritis but no acute abnormalities - Diclofenac 75 mg twice daily - Continue gabapentin 400 mg and titrate to 3 times per day - Home exercises for stretching - Ice or heat - Will pursue MRI.  Follow-up after MRI complete

## 2018-09-02 NOTE — Addendum Note (Signed)
Addended by: Kathi Simpers F on: 09/02/2018 09:52 AM   Modules accepted: Orders

## 2018-09-14 ENCOUNTER — Other Ambulatory Visit: Payer: Self-pay

## 2018-09-14 ENCOUNTER — Ambulatory Visit (INDEPENDENT_AMBULATORY_CARE_PROVIDER_SITE_OTHER): Payer: 59

## 2018-09-14 DIAGNOSIS — M542 Cervicalgia: Secondary | ICD-10-CM

## 2018-09-14 DIAGNOSIS — M50322 Other cervical disc degeneration at C5-C6 level: Secondary | ICD-10-CM | POA: Diagnosis not present

## 2018-09-14 DIAGNOSIS — M4802 Spinal stenosis, cervical region: Secondary | ICD-10-CM | POA: Diagnosis not present

## 2018-10-21 MED FILL — DICLOFENAC SODIUM 75 MG TAB: 75 | 30 days supply | Qty: 60 | Fill #1

## 2018-12-02 ENCOUNTER — Telehealth: Payer: Self-pay | Admitting: *Deleted

## 2018-12-02 MED ORDER — DICLOFENAC SODIUM 75 MG PO TBEC: 75.0000 mg | DELAYED_RELEASE_TABLET | Freq: Two times a day (BID) | ORAL | 1 refills | Status: DC

## 2018-12-02 NOTE — Telephone Encounter (Signed)
Will refill her diclofenac because she feels medication was helping a lot and she will call us once she wants to start PT

## 2019-02-09 DIAGNOSIS — R21 Rash and other nonspecific skin eruption: Secondary | ICD-10-CM | POA: Diagnosis not present

## 2019-02-09 DIAGNOSIS — L308 Other specified dermatitis: Secondary | ICD-10-CM | POA: Diagnosis not present

## 2019-06-05 DIAGNOSIS — Z79899 Other long term (current) drug therapy: Secondary | ICD-10-CM | POA: Diagnosis not present

## 2019-06-05 DIAGNOSIS — M79641 Pain in right hand: Secondary | ICD-10-CM | POA: Diagnosis not present

## 2019-06-05 DIAGNOSIS — M533 Sacrococcygeal disorders, not elsewhere classified: Secondary | ICD-10-CM | POA: Diagnosis not present

## 2019-06-05 DIAGNOSIS — M1990 Unspecified osteoarthritis, unspecified site: Secondary | ICD-10-CM | POA: Diagnosis not present

## 2019-06-05 DIAGNOSIS — M7732 Calcaneal spur, left foot: Secondary | ICD-10-CM | POA: Diagnosis not present

## 2019-06-05 DIAGNOSIS — M19072 Primary osteoarthritis, left ankle and foot: Secondary | ICD-10-CM | POA: Diagnosis not present

## 2019-06-05 DIAGNOSIS — M79642 Pain in left hand: Secondary | ICD-10-CM | POA: Diagnosis not present

## 2020-01-01 ENCOUNTER — Telehealth: Payer: Self-pay | Admitting: Family Medicine

## 2020-01-01 NOTE — Telephone Encounter (Signed)
Patient wants to inform Carmelia Roller she has tested positive for Covid and wants to know if there any precautions she should take or beaware of. She is feeling bad but nothing alarming.

## 2020-01-01 NOTE — Telephone Encounter (Signed)
Called the number to [facility] infusion site at [phone] and left all information they had informed to leave on message.Kaitlyn Hancock the patient informed of PCP instructions and also informed her we did call the Antibody infusion site with her information and that they may be calling her.  The patient verbalized understanding.

## 2020-01-01 NOTE — Telephone Encounter (Signed)
Supportive care. Look out for shortness of breath or worsening symptoms. Can we reach out to Ab team to see if she qualifies for Regeneron 2/2 her weight plz? Ty.

## 2020-01-02 ENCOUNTER — Other Ambulatory Visit: Payer: Self-pay | Admitting: Oncology

## 2020-01-02 DIAGNOSIS — U071 COVID-19: Secondary | ICD-10-CM

## 2020-01-02 NOTE — Progress Notes (Signed)
I connected by phone with  Kaitlyn Hancock  to discuss the potential use of an new treatment for mild to moderate COVID-19 viral infection in non-hospitalized patients.   This patient is a age/sex that meets the FDA criteria for Emergency Use Authorization of casirivimab\imdevimab.  Has a (+) direct SARS-CoV-2 viral test result 1. Has mild or moderate COVID-19  2. Is ? 49 years of age and weighs ? 40 kg 3. Is NOT hospitalized due to COVID-19 4. Is NOT requiring oxygen therapy or requiring an increase in baseline oxygen flow rate due to COVID-19 5. Is within 10 days of symptom onset 6. Has at least one of the high risk factor(s) for progression to severe COVID-19 and/or hospitalization as defined in EUA. ? Specific high risk criteria : RA- imunnocompromised    Symptom onset  12/28/19   I have spoken and communicated the following to the patient or parent/caregiver:   1. FDA has authorized the emergency use of casirivimab\imdevimab for the treatment of mild to moderate COVID-19 in adults and pediatric patients with positive results of direct SARS-CoV-2 viral testing who are 25 years of age and older weighing at least 40 kg, and who are at high risk for progressing to severe COVID-19 and/or hospitalization.   2. The significant known and potential risks and benefits of casirivimab\imdevimab, and the extent to which such potential risks and benefits are unknown.   3. Information on available alternative treatments and the risks and benefits of those alternatives, including clinical trials.   4. Patients treated with casirivimab\imdevimab should continue to self-isolate and use infection control measures (e.g., wear mask, isolate, social distance, avoid sharing personal items, clean and disinfect "high touch" surfaces, and frequent handwashing) according to CDC guidelines.    5. The patient or parent/caregiver has the option to accept or refuse casirivimab\imdevimab .   After reviewing this  information with the patient, The patient agreed to proceed with receiving casirivimab\imdevimab infusion and will be provided a copy of the Fact sheet prior to receiving the infusion.Mignon Pine, AGNP-C [phone] (Infusion Center Hotline)

## 2020-01-03 ENCOUNTER — Ambulatory Visit (HOSPITAL_COMMUNITY)
Admission: RE | Admit: 2020-01-03 | Discharge: 2020-01-03 | Disposition: A | Discharge status: Home / Self Care | Payer: No Typology Code available for payment source | Source: Ambulatory Visit | Attending: Pulmonary Disease | Admitting: Pulmonary Disease

## 2020-01-03 DIAGNOSIS — U071 COVID-19: Secondary | ICD-10-CM | POA: Diagnosis not present

## 2020-01-03 DIAGNOSIS — M069 Rheumatoid arthritis, unspecified: Secondary | ICD-10-CM | POA: Insufficient documentation

## 2020-01-03 MED ORDER — FAMOTIDINE IN NACL 20-0.9 MG/50ML-% IV SOLN: 20.0000 mg | Freq: Once | INTRAVENOUS | Status: DC | PRN

## 2020-01-03 MED ORDER — ALBUTEROL SULFATE HFA 108 (90 BASE) MCG/ACT IN AERS: 2.0000 | INHALATION_SPRAY | Freq: Once | RESPIRATORY_TRACT | Status: DC | PRN

## 2020-01-03 MED ORDER — SODIUM CHLORIDE 0.9 % IV SOLN: INTRAVENOUS | Status: DC | PRN

## 2020-01-03 MED ORDER — METHYLPREDNISOLONE SODIUM SUCC 125 MG IJ SOLR: 125.0000 mg | Freq: Once | INTRAMUSCULAR | Status: DC | PRN

## 2020-01-03 MED ORDER — DIPHENHYDRAMINE HCL 50 MG/ML IJ SOLN: 50.0000 mg | Freq: Once | INTRAMUSCULAR | Status: DC | PRN

## 2020-01-03 MED ORDER — EPINEPHRINE 0.3 MG/0.3ML IJ SOAJ: 0.3000 mg | Freq: Once | INTRAMUSCULAR | Status: DC | PRN

## 2020-01-03 MED ORDER — SODIUM CHLORIDE 0.9 % IV SOLN
1200.0000 mg | Freq: Once | INTRAVENOUS | Status: AC
  Administered 2020-01-03: 1200 mg via INTRAVENOUS
  Filled 2020-01-03: qty 10

## 2020-01-03 NOTE — Progress Notes (Signed)
  Diagnosis: COVID-19  Physician: Wright, MD  Procedure: [facility] Med: casirivimab\imdevimab infusion - Provided patient with casirivimab\imdevimab fact sheet for patients, parents and caregivers prior to infusion.  Complications: No immediate complications noted.  Discharge: Discharged home   Ryland Smoots R Igor Bishop 01/03/2020   

## 2020-01-03 NOTE — Discharge Instructions (Signed)

## 2020-02-24 ENCOUNTER — Ambulatory Visit: Payer: No Typology Code available for payment source | Admitting: Family Medicine

## 2020-02-24 ENCOUNTER — Other Ambulatory Visit: Payer: Self-pay

## 2020-02-24 ENCOUNTER — Encounter: Payer: Self-pay | Admitting: Family Medicine

## 2020-02-24 VITALS — BP 160/96 | HR 102 | Temp 98.7°F | Ht 65.0 in | Wt 254.5 lb

## 2020-02-24 DIAGNOSIS — I10 Essential (primary) hypertension: Secondary | ICD-10-CM | POA: Diagnosis not present

## 2020-02-24 MED ORDER — AMLODIPINE BESYLATE 5 MG PO TABS: 5.0000 mg | ORAL_TABLET | Freq: Every day | ORAL | 3 refills | Status: DC

## 2020-02-24 MED FILL — AMLODIPINE BESYLATE 5 MG TA: 5 | 30 days supply | Qty: 30 | Fill #0

## 2020-02-24 NOTE — Patient Instructions (Addendum)
Aim to do some physical exertion for 150 minutes per week. This is typically divided into 5 days per week, 30 minutes per day. The activity should be enough to get your heart rate up. Anything is better than nothing if you have time constraints.  Continue checking your blood pressures at home.  Let us know if you need anything.   DASH Eating Plan DASH stands for "Dietary Approaches to Stop Hypertension." The DASH eating plan is a healthy eating plan that has been shown to reduce high blood pressure (hypertension). It may also reduce your risk for type 2 diabetes, heart disease, and stroke. The DASH eating plan may also help with weight loss. What are tips for following this plan?  General guidelines  Avoid eating more than 2,300 mg (milligrams) of salt (sodium) a day. If you have hypertension, you may need to reduce your sodium intake to 1,500 mg a day.  Limit alcohol intake to no more than 1 drink a day for nonpregnant women and 2 drinks a day for men. One drink equals 12 oz of beer, 5 oz of wine, or 1 oz of hard liquor.  Work with your health care provider to maintain a healthy body weight or to lose weight. Ask what an ideal weight is for you.  Get at least 30 minutes of exercise that causes your heart to beat faster (aerobic exercise) most days of the week. Activities may include walking, swimming, or biking.  Work with your health care provider or diet and nutrition specialist (dietitian) to adjust your eating plan to your individual calorie needs. Reading food labels   Check food labels for the amount of sodium per serving. Choose foods with less than 5 percent of the Daily Value of sodium. Generally, foods with less than 300 mg of sodium per serving fit into this eating plan.  To find whole grains, look for the word "whole" as the first word in the ingredient list. Shopping  Buy products labeled as "low-sodium" or "no salt added."  Buy fresh foods. Avoid canned foods and  premade or frozen meals. Cooking  Avoid adding salt when cooking. Use salt-free seasonings or herbs instead of table salt or sea salt. Check with your health care provider or pharmacist before using salt substitutes.  Do not fry foods. Cook foods using healthy methods such as baking, boiling, grilling, and broiling instead.  Cook with heart-healthy oils, such as olive, canola, soybean, or sunflower oil. Meal planning  Eat a balanced diet that includes: ? 5 or more servings of fruits and vegetables each day. At each meal, try to fill half of your plate with fruits and vegetables. ? Up to 6-8 servings of whole grains each day. ? Less than 6 oz of lean meat, poultry, or fish each day. A 3-oz serving of meat is about the same size as a deck of cards. One egg equals 1 oz. ? 2 servings of low-fat dairy each day. ? A serving of nuts, seeds, or beans 5 times each week. ? Heart-healthy fats. Healthy fats called Omega-3 fatty acids are found in foods such as flaxseeds and coldwater fish, like sardines, salmon, and mackerel.  Limit how much you eat of the following: ? Canned or prepackaged foods. ? Food that is high in trans fat, such as fried foods. ? Food that is high in saturated fat, such as fatty meat. ? Sweets, desserts, sugary drinks, and other foods with added sugar. ? Full-fat dairy products.  Do not salt foods  before eating.  Try to eat at least 2 vegetarian meals each week.  Eat more home-cooked food and less restaurant, buffet, and fast food.  When eating at a restaurant, ask that your food be prepared with less salt or no salt, if possible. What foods are recommended? The items listed may not be a complete list. Talk with your dietitian about what dietary choices are best for you. Grains Whole-grain or whole-wheat bread. Whole-grain or whole-wheat pasta. Brown rice. Orpah Cobb. Bulgur. Whole-grain and low-sodium cereals. Pita bread. Low-fat, low-sodium crackers. Whole-wheat  flour tortillas. Vegetables Fresh or frozen vegetables (raw, steamed, roasted, or grilled). Low-sodium or reduced-sodium tomato and vegetable juice. Low-sodium or reduced-sodium tomato sauce and tomato paste. Low-sodium or reduced-sodium canned vegetables. Fruits All fresh, dried, or frozen fruit. Canned fruit in natural juice (without added sugar). Meat and other protein foods Skinless chicken or [place]. Ground chicken or [place]. Pork with fat trimmed off. Fish and seafood. Egg whites. Dried beans, peas, or lentils. Unsalted nuts, nut butters, and seeds. Unsalted canned beans. Lean cuts of beef with fat trimmed off. Low-sodium, lean deli meat. Dairy Low-fat (1%) or fat-free (skim) milk. Fat-free, low-fat, or reduced-fat cheeses. Nonfat, low-sodium ricotta or cottage cheese. Low-fat or nonfat yogurt. Low-fat, low-sodium cheese. Fats and oils Soft margarine without trans fats. Vegetable oil. Low-fat, reduced-fat, or light mayonnaise and salad dressings (reduced-sodium). Canola, safflower, olive, soybean, and sunflower oils. Avocado. Seasoning and other foods Herbs. Spices. Seasoning mixes without salt. Unsalted popcorn and pretzels. Fat-free sweets. What foods are not recommended? The items listed may not be a complete list. Talk with your dietitian about what dietary choices are best for you. Grains Baked goods made with fat, such as croissants, muffins, or some breads. Dry pasta or rice meal packs. Vegetables Creamed or fried vegetables. Vegetables in a cheese sauce. Regular canned vegetables (not low-sodium or reduced-sodium). Regular canned tomato sauce and paste (not low-sodium or reduced-sodium). Regular tomato and vegetable juice (not low-sodium or reduced-sodium). Rosita Fire. Olives. Fruits Canned fruit in a light or heavy syrup. Fried fruit. Fruit in cream or butter sauce. Meat and other protein foods Fatty cuts of meat. Ribs. Fried meat. Tomasa Blase. Sausage. Bologna and other processed lunch  meats. Salami. Fatback. Hotdogs. Bratwurst. Salted nuts and seeds. Canned beans with added salt. Canned or smoked fish. Whole eggs or egg yolks. Chicken or [place] with skin. Dairy Whole or 2% milk, cream, and half-and-half. Whole or full-fat cream cheese. Whole-fat or sweetened yogurt. Full-fat cheese. Nondairy creamers. Whipped toppings. Processed cheese and cheese spreads. Fats and oils Butter. Stick margarine. Lard. Shortening. Ghee. Bacon fat. Tropical oils, such as coconut, palm kernel, or palm oil. Seasoning and other foods Salted popcorn and pretzels. Onion salt, garlic salt, seasoned salt, table salt, and sea salt. Worcestershire sauce. Tartar sauce. Barbecue sauce. Teriyaki sauce. Soy sauce, including reduced-sodium. Steak sauce. Canned and packaged gravies. Fish sauce. Oyster sauce. Cocktail sauce. Horseradish that you find on the shelf. Ketchup. Mustard. Meat flavorings and tenderizers. Bouillon cubes. Hot sauce and Tabasco sauce. Premade or packaged marinades. Premade or packaged taco seasonings. Relishes. Regular salad dressings. Where to find more information:  National Heart, Lung, and Blood Institute: PopSteam.is  American Heart Association: www.heart.org Summary  The DASH eating plan is a healthy eating plan that has been shown to reduce high blood pressure (hypertension). It may also reduce your risk for type 2 diabetes, heart disease, and stroke.  With the DASH eating plan, you should limit salt (sodium) intake to 2,300 mg  a day. If you have hypertension, you may need to reduce your sodium intake to 1,500 mg a day.  When on the DASH eating plan, aim to eat more fresh fruits and vegetables, whole grains, lean proteins, low-fat dairy, and heart-healthy fats.  Work with your health care provider or diet and nutrition specialist (dietitian) to adjust your eating plan to your individual calorie needs. This information is not intended to replace advice given to you by your  health care provider. Make sure you discuss any questions you have with your health care provider. Document Revised: 04/05/2017 Document Reviewed: 04/16/2016 Elsevier Patient Education  2020 [employer].

## 2020-02-24 NOTE — Progress Notes (Signed)
Chief Complaint  Patient presents with  . Hypertension    Subjective Kaitlyn Hancock is a 49 y.o. female who presents for BP evaluation over past month. She does monitor home blood pressures. Blood pressures ranging from 150-160's/80-90's on average. She is not on any medications She is now adhering to a healthy diet overall. Current exercise: nothing   Past Medical History:  Diagnosis Date  . Eczema 2014  . History of chicken pox     Exam BP (!) 160/96 (BP Location: Left Arm, Patient Position: Sitting, Cuff Size: Large)   Pulse (!) 102   Temp 98.7 F (37.1 C) (Oral)   Ht 5\' 5"  (1.651 m)   Wt 254 lb 8 oz (115.4 kg)   SpO2 96%   BMI 42.35 kg/m  General:  well developed, well nourished, in no apparent distress Heart: RRR, no bruits, no LE edema Lungs: clear to auscultation, no accessory muscle use Psych: well oriented with normal range of affect and appropriate judgment/insight  Essential hypertension - Plan: amLODipine (NORVASC) 5 MG tablet  Morbid obesity (HCC)  Status: Uncontrolled. Start Norvasc 5 mg/d. Monitor BP at home. Counseled on diet and exercise. She is starting to make good changes.  F/u in 1 mo. The patient voiced understanding and agreement to the plan.  [place], DO 02/24/20  9:39 AM

## 2020-03-28 ENCOUNTER — Other Ambulatory Visit: Payer: Self-pay | Admitting: Family Medicine

## 2020-03-28 ENCOUNTER — Encounter: Payer: Self-pay | Admitting: Family Medicine

## 2020-03-28 ENCOUNTER — Ambulatory Visit: Payer: No Typology Code available for payment source | Admitting: Family Medicine

## 2020-03-28 ENCOUNTER — Other Ambulatory Visit: Payer: Self-pay

## 2020-03-28 ENCOUNTER — Other Ambulatory Visit (INDEPENDENT_AMBULATORY_CARE_PROVIDER_SITE_OTHER): Payer: No Typology Code available for payment source

## 2020-03-28 VITALS — BP 130/82 | HR 97 | Temp 98.0°F | Ht 65.0 in | Wt 254.4 lb

## 2020-03-28 DIAGNOSIS — R0683 Snoring: Secondary | ICD-10-CM | POA: Diagnosis not present

## 2020-03-28 DIAGNOSIS — I10 Essential (primary) hypertension: Secondary | ICD-10-CM

## 2020-03-28 DIAGNOSIS — R739 Hyperglycemia, unspecified: Secondary | ICD-10-CM | POA: Diagnosis not present

## 2020-03-28 DIAGNOSIS — E559 Vitamin D deficiency, unspecified: Secondary | ICD-10-CM

## 2020-03-28 DIAGNOSIS — R5383 Other fatigue: Secondary | ICD-10-CM

## 2020-03-28 LAB — CBC
HCT: 39.9 % (ref 36.0–46.0)
Hemoglobin: 13 g/dL (ref 12.0–15.0)
MCHC: 32.7 g/dL (ref 30.0–36.0)
MCV: 84.3 fl (ref 78.0–100.0)
Platelets: 314 10*3/uL (ref 150.0–400.0)
RBC: 4.73 Mil/uL (ref 3.87–5.11)
RDW: 14.2 % (ref 11.5–15.5)
WBC: 8.2 10*3/uL (ref 4.0–10.5)

## 2020-03-28 LAB — COMPREHENSIVE METABOLIC PANEL
ALT: 17 U/L (ref 0–35)
AST: 11 U/L (ref 0–37)
Albumin: 4.2 g/dL (ref 3.5–5.2)
Alkaline Phosphatase: 96 U/L (ref 39–117)
BUN: 20 mg/dL (ref 6–23)
CO2: 30 mEq/L (ref 19–32)
Calcium: 8.9 mg/dL (ref 8.4–10.5)
Chloride: 102 mEq/L (ref 96–112)
Creatinine, Ser: 0.84 mg/dL (ref 0.40–1.20)
GFR: 80.73 mL/min (ref >60.00)
Glucose, Bld: 117 mg/dL — ABNORMAL HIGH (ref 70–99)
Potassium: 4.4 mEq/L (ref 3.5–5.1)
Sodium: 139 mEq/L (ref 135–145)
Total Bilirubin: 0.4 mg/dL (ref 0.2–1.2)
Total Protein: 7.3 g/dL (ref 6.0–8.3)

## 2020-03-28 LAB — TSH: TSH: 2.1 u[IU]/mL (ref 0.35–4.50)

## 2020-03-28 LAB — HEMOGLOBIN A1C: Hgb A1c MFr Bld: 5.9 % (ref 4.6–6.5)

## 2020-03-28 LAB — T4, FREE: Free T4: 0.91 ng/dL (ref 0.60–1.60)

## 2020-03-28 LAB — VITAMIN D 25 HYDROXY (VIT D DEFICIENCY, FRACTURES): VITD: 15.04 ng/mL — ABNORMAL LOW (ref 30.00–100.00)

## 2020-03-28 MED ORDER — VITAMIN D (ERGOCALCIFEROL) 1.25 MG (50000 UNIT) PO CAPS: 50000.0000 [IU] | ORAL_CAPSULE | ORAL | 0 refills | Status: DC

## 2020-03-28 NOTE — Patient Instructions (Addendum)
If you do not hear anything about your referral in the next 1-2 weeks, call our office and ask for an update.  Give [place] 2-3 business days to get the results of your labs back.   Keep the diet clean and stay active.  Bring your blood pressure monitor to your nurse visit in 2 weeks.    Let us know if you need anything.

## 2020-03-28 NOTE — Progress Notes (Signed)
Chief Complaint  Patient presents with   Follow-up    Subjective Kaitlyn Hancock is a 49 y.o. female who presents for hypertension follow up. She does monitor home blood pressures. Blood pressures ranging from 130-150's/80's on average. She is compliant with medication- Norvasc 5 mg/d. Patient has these side effects of medication: none She is sometimes adhering to a healthy diet overall.  Current exercise: some walking   Pt had covid in Aug, has had fatigue since then. No unexplained wt loss. Diet/exercise as above. She does snore at night. Mood is stable. Has not had sleep study. No bleeding.   Past Medical History:  Diagnosis Date   Eczema 2014   History of chicken pox     Exam BP 130/82 (BP Location: Left Arm, Patient Position: Sitting, Cuff Size: Large)    Pulse 97    Temp 98 F (36.7 C) (Oral)    Ht 5\' 5"  (1.651 m)    Wt 254 lb 6 oz (115.4 kg)    SpO2 97%    BMI 42.33 kg/m  General:  well developed, well nourished, in no apparent distress Heart: RRR, no bruits, no LE edema Lungs: clear to auscultation, no accessory muscle use Psych: well oriented with normal range of affect and appropriate judgment/insight  Essential hypertension  Fatigue, unspecified type - Plan: Ambulatory referral to Pulmonology, CBC, Comprehensive metabolic panel, VITAMIN D 25 Hydroxy (Vit-D Deficiency, Fractures), TSH, T4, free  Snoring - Plan: Ambulatory referral to Pulmonology  1. Cont Norvasc 5 mg/d. I want her to bring her BP monitor in to nurse visit in 2 weeks and f/u based on that. If she is controlled, I will see her in 3 mo for well visit. If uncontrolled, will see her in 1 mo to f/u. Would likely increase Norvasc to 10 mg/d and consider ARB at f/u. Counseled on diet and exercise. 2/3. Ck labs. OSA high on ddx, will refer to sleep colleagues.  The patient voiced understanding and agreement to the plan.  [place], DO 03/28/20  7:28 AM

## 2020-04-12 ENCOUNTER — Ambulatory Visit: Payer: No Typology Code available for payment source

## 2020-07-01 ENCOUNTER — Telehealth: Payer: Self-pay

## 2020-07-01 ENCOUNTER — Other Ambulatory Visit: Payer: No Typology Code available for payment source

## 2020-07-01 NOTE — Telephone Encounter (Signed)
Appointment canceled.

## 2020-07-01 NOTE — Telephone Encounter (Signed)
Caller Name Dajanique Robley Caller Phone Number [phone] Patient Name Kaitlyn Hancock Patient DOB [birth year] Call Type Message Only Information Provided Reason for Call Request to [facility] Appointment Initial Comment Caller has appointment at 715 and can't make it due to son being sick. Additional Comment Office hours provided. Triage refused Disp. Time Disposition Final User 07/01/2020 7:03:32 AM General Information Provided Yes Tessa Lerner Call Closed By: Tessa Lerner Transaction Date/Time: 07/01/2020 7:01:36 AM (ET)

## 2020-07-03 ENCOUNTER — Other Ambulatory Visit: Payer: Self-pay | Admitting: Family Medicine

## 2020-07-03 DIAGNOSIS — I10 Essential (primary) hypertension: Secondary | ICD-10-CM

## 2020-08-02 IMAGING — DX CERVICAL SPINE - COMPLETE 4+ VIEW
7 series · 7 of 7 positions shown · non-contrast
Comparison: None.

CLINICAL DATA: Right neck and right arm pain.

EXAM:
CERVICAL SPINE - COMPLETE 4+ VIEW

[c-spine lat]
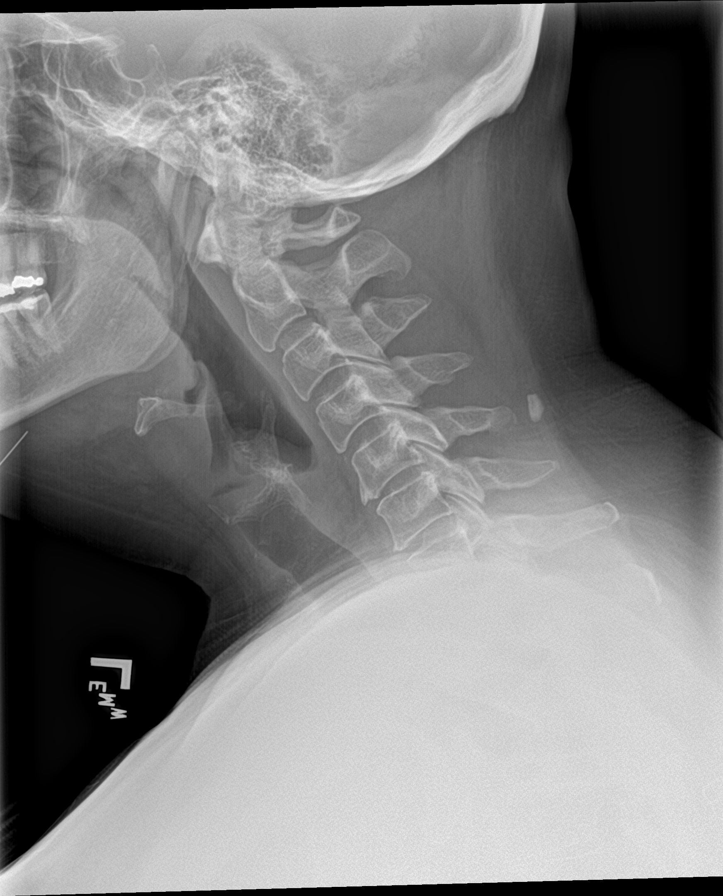

[c-spine obl (1 of 2)]
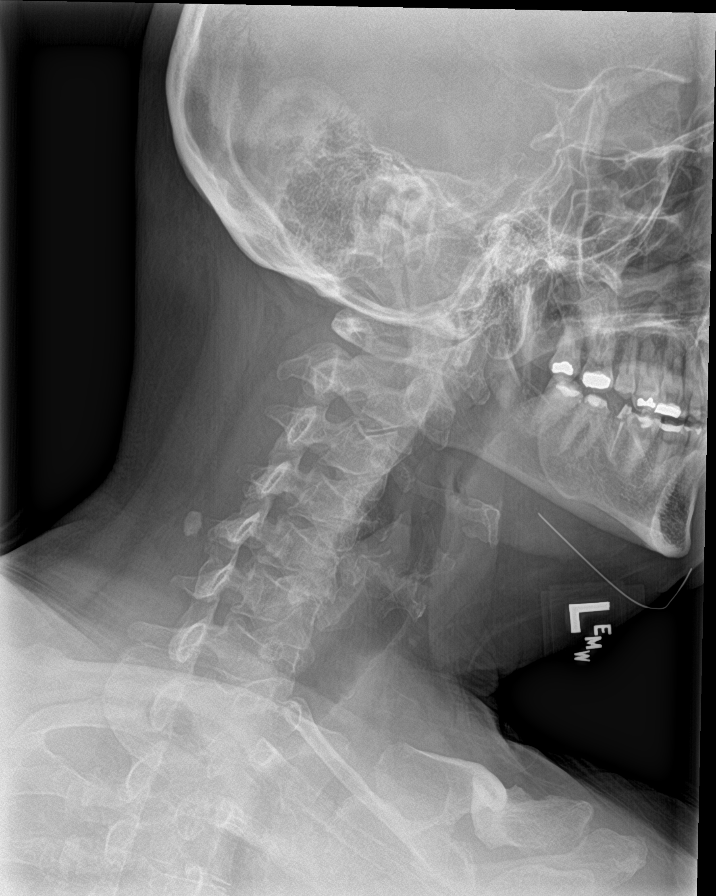

[c-spine obl (2 of 2)]
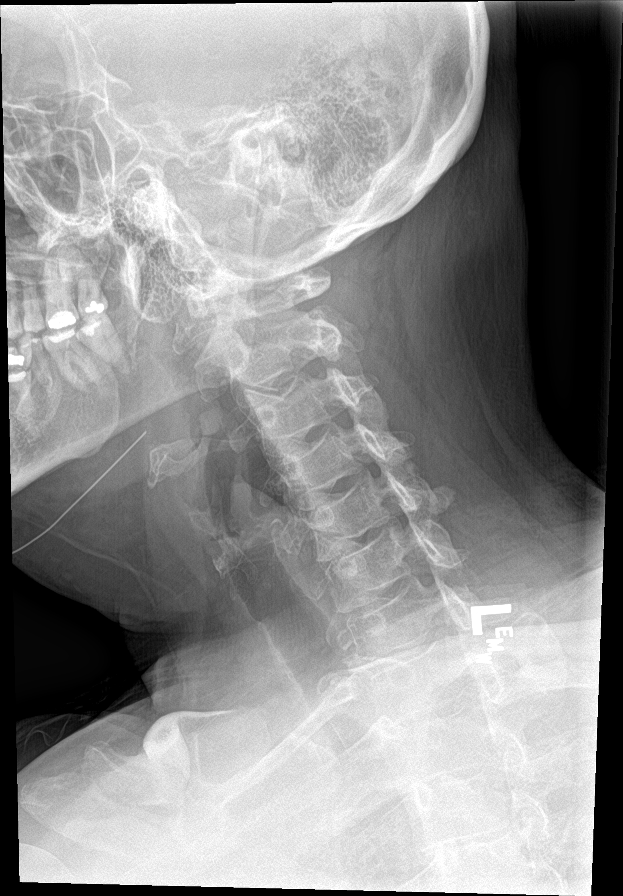

[c-spine ap (1 of 2)]
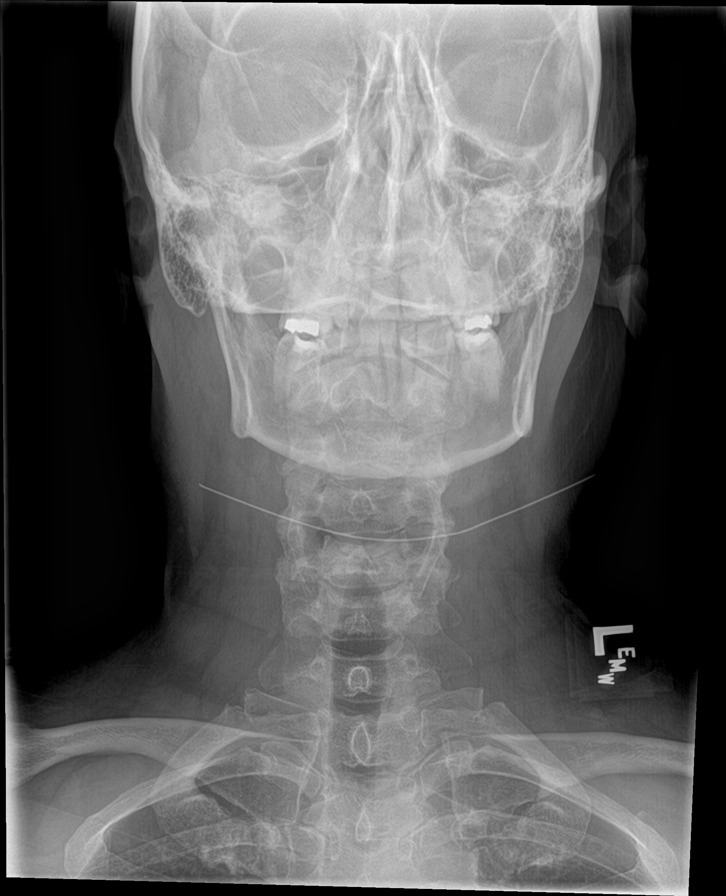

[c-spine open mouth]
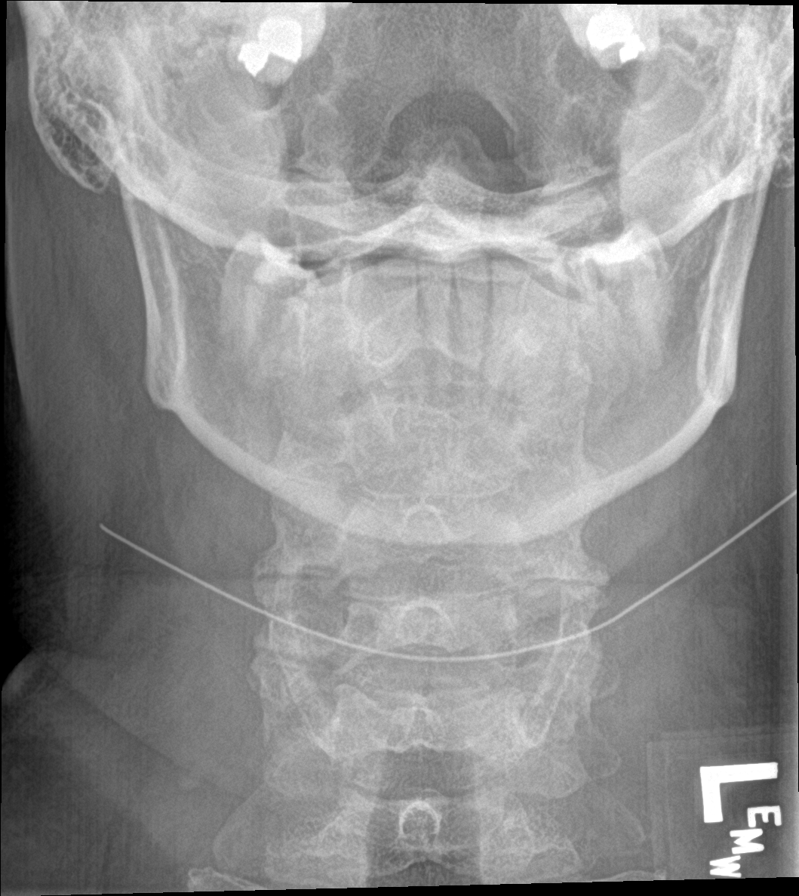

[c-spine swimmers]
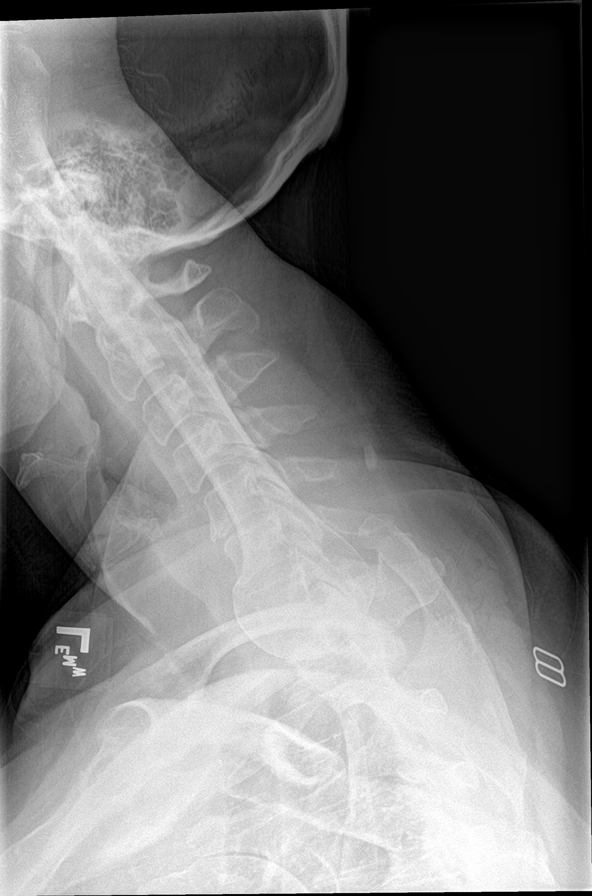

[c-spine ap (2 of 2)]
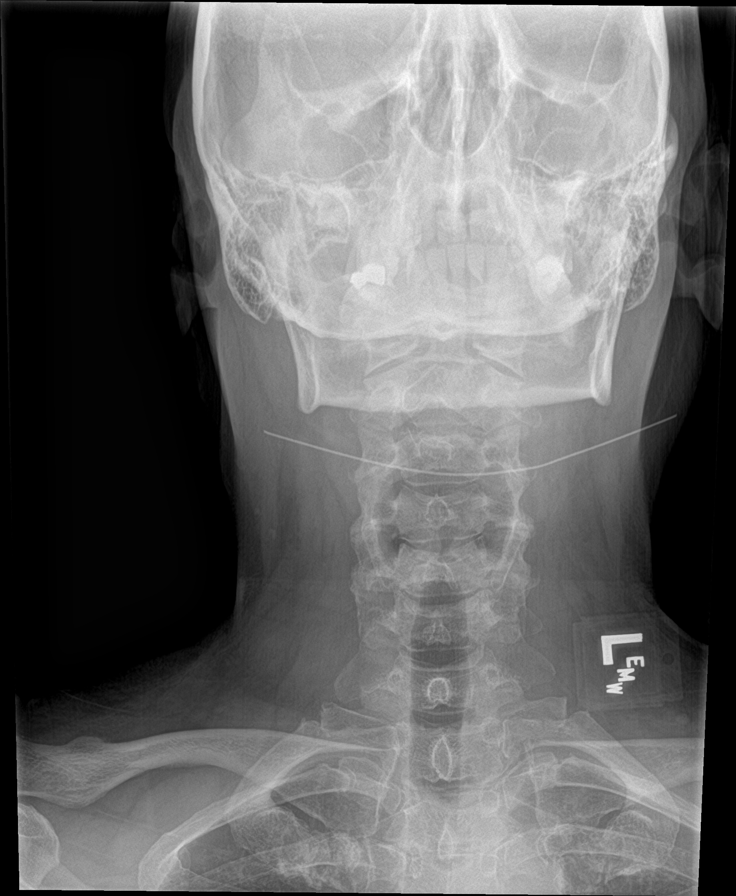

[7 of 7 positions shown; findings below may reference images not displayed]

FINDINGS: No fracture or spondylolisthesis is noted. Mild degenerative disc
disease is noted at C5-6 and C6-7 with anterior osteophyte
formation. No prevertebral soft tissue swelling is noted. Mild
bilateral neural foraminal stenosis is noted at C4-5, C5-6 and C6-7
secondary to uncovertebral spurring.
IMPRESSION: Mild multilevel degenerative disc disease with bilateral neural
foraminal stenosis secondary to uncovertebral spurring. No acute
abnormality seen in the cervical spine.

## 2021-05-10 ENCOUNTER — Other Ambulatory Visit: Payer: Self-pay | Admitting: Family Medicine

## 2021-05-10 DIAGNOSIS — Z1231 Encounter for screening mammogram for malignant neoplasm of breast: Secondary | ICD-10-CM

## 2021-05-25 ENCOUNTER — Ambulatory Visit
Admission: RE | Admit: 2021-05-25 | Discharge: 2021-05-25 | Disposition: A | Discharge status: Home / Self Care | Payer: 59 | Source: Ambulatory Visit | Attending: Family Medicine | Admitting: Family Medicine

## 2021-05-25 DIAGNOSIS — Z1231 Encounter for screening mammogram for malignant neoplasm of breast: Secondary | ICD-10-CM

## 2022-06-29 ENCOUNTER — Other Ambulatory Visit (HOSPITAL_BASED_OUTPATIENT_CLINIC_OR_DEPARTMENT_OTHER): Payer: Self-pay

## 2022-06-29 ENCOUNTER — Encounter: Payer: Self-pay | Admitting: Family Medicine

## 2022-06-29 ENCOUNTER — Ambulatory Visit: Payer: No Typology Code available for payment source | Admitting: Family Medicine

## 2022-06-29 VITALS — BP 170/102 | HR 84 | Temp 98.4°F

## 2022-06-29 DIAGNOSIS — R0683 Snoring: Secondary | ICD-10-CM

## 2022-06-29 DIAGNOSIS — J3489 Other specified disorders of nose and nasal sinuses: Secondary | ICD-10-CM

## 2022-06-29 DIAGNOSIS — I10 Essential (primary) hypertension: Secondary | ICD-10-CM

## 2022-06-29 MED ORDER — AMLODIPINE BESYLATE 5 MG PO TABS
5.0000 mg | ORAL_TABLET | Freq: Every day | ORAL | 2 refills | Status: DC
  Filled 2022-06-29: qty 30, 30d supply, fill #0
  Filled 2022-10-22: qty 30, 30d supply, fill #1
  Filled 2023-03-07: qty 90, 90d supply, fill #1

## 2022-06-29 MED ORDER — OLMESARTAN MEDOXOMIL 20 MG PO TABS: 20.0000 mg | ORAL_TABLET | Freq: Every day | ORAL | 2 refills | Status: DC

## 2022-06-29 MED ORDER — FLUTICASONE PROPIONATE 50 MCG/ACT NA SUSP: 2.0000 | Freq: Every day | NASAL | 6 refills | Status: AC

## 2022-06-29 NOTE — Patient Instructions (Signed)
Claritin (loratadine), Allegra (fexofenadine), Zyrtec (cetirizine) which is also equivalent to Xyzal (levocetirizine); these are listed in order from weakest to strongest. Generic, and therefore cheaper, options are in the parentheses.   Flonase (fluticasone); nasal spray that is over the counter. 2 sprays each nostril, once daily. Aim towards the same side eye when you spray.  There are available OTC, and the generic versions, which may be cheaper, are in parentheses. Show this to a pharmacist if you have trouble finding any of these items.  Aim to do some physical exertion for 150 minutes per week. This is typically divided into 5 days per week, 30 minutes per day. The activity should be enough to get your heart rate up. Anything is better than nothing if you have time constraints.  Check your blood pressures 2-3 times per week, alternating the time of day you check it. If it is high, considering waiting 1-2 minutes and rechecking. If it gets higher, your anxiety is likely creeping up and we should avoid rechecking.   Keep the diet clean and stay active.  Let us know if you need anything.

## 2022-06-29 NOTE — Progress Notes (Signed)
Chief Complaint  Patient presents with   Post Covid symptoms    Stuffed up nose and eyes are crusty in the mornings   Hypertension    Subjective Kaitlyn Hancock is a 52 y.o. female who presents for hypertension follow up. She does monitor home blood pressures. Blood pressures ranging from 140-160's/90-100's on average. She is compliant with medication- Norvasc 5 mg/d. Patient has these side effects of medication: none She is sometimes adhering to a healthy diet overall. Current exercise: none No CP or SOB.  Sinus pressure Duration: 7 weeks  Associated symptoms:  sinus pressure, slight cough Denies: sinus congestion, sinus pain, rhinorrhea, itchy watery eyes, ear pain, ear drainage, sore throat, wheezing, shortness of breath, myalgia, and fevers Treatment to date: Amoxicillin, Sudafed Sick contacts: No Had covid and flu prior to this happening.    Past Medical History:  Diagnosis Date   Eczema 2014   History of chicken pox     Exam BP (!) 170/102 (BP Location: Left Arm, Cuff Size: Large)   Pulse 84   Temp 98.4 F (36.9 C) (Oral)   SpO2 97%  General:  well developed, well nourished, in no apparent distress HEENT: Canals patent, no otorrhea, TM's neg b/l, sinus pressure but no ttp b/l, nares patent w/o rhinorrhea, MMM, no pharyngeal exudate/erythema.  Heart: RRR, no bruits, no LE edema Lungs: clear to auscultation, no accessory muscle use Psych: well oriented with normal range of affect and appropriate judgment/insight  Essential hypertension - Plan: olmesartan (BENICAR) 20 MG tablet, Basic Metabolic Panel (BMET)  Sinus pressure - Plan: fluticasone (FLONASE) 50 MCG/ACT nasal spray  Snoring - Plan: Ambulatory referral to Neurology  Chronic, uncontrolled. Cont Counseled on diet and exercise. Cont Norvasc 5 mg/d. Add Benicar 20 mg/d. F/u in 1 week for BMP.  INCS.  Could have underlying OSA contributing. Refer to sleep team again. Had referral in past but did not make it.  F/u  in 1 mo to reck #1. The patient voiced understanding and agreement to the plan.  [place], DO 06/29/22  12:40 PM

## 2022-07-05 ENCOUNTER — Encounter: Payer: Self-pay | Admitting: Family Medicine

## 2022-07-06 ENCOUNTER — Other Ambulatory Visit (INDEPENDENT_AMBULATORY_CARE_PROVIDER_SITE_OTHER): Payer: No Typology Code available for payment source

## 2022-07-06 ENCOUNTER — Ambulatory Visit: Payer: No Typology Code available for payment source | Admitting: *Deleted

## 2022-07-06 DIAGNOSIS — I10 Essential (primary) hypertension: Secondary | ICD-10-CM

## 2022-07-06 LAB — BASIC METABOLIC PANEL
BUN: 23 mg/dL (ref 6–23)
CO2: 28 mEq/L (ref 19–32)
Calcium: 9.1 mg/dL (ref 8.4–10.5)
Chloride: 103 mEq/L (ref 96–112)
Creatinine, Ser: 0.8 mg/dL (ref 0.40–1.20)
GFR: 84.24 mL/min (ref >60.00)
Glucose, Bld: 126 mg/dL — ABNORMAL HIGH (ref 70–99)
Potassium: 4.6 mEq/L (ref 3.5–5.1)
Sodium: 139 mEq/L (ref 135–145)

## 2022-07-06 NOTE — Progress Notes (Signed)
Pt was in for a lab appointment this morning and asked if we could do a BP check since she's been getting really high readings at home.  First reading here was 148/79.  Recheck after 10 min of sitting quietly was 130/77.

## 2022-07-06 NOTE — Addendum Note (Signed)
Addended by: Beatris Ship L on: 07/06/2022 11:39 AM   Modules accepted: Level of Service

## 2022-08-06 ENCOUNTER — Encounter: Payer: Self-pay | Admitting: Family Medicine

## 2022-08-06 ENCOUNTER — Ambulatory Visit: Payer: No Typology Code available for payment source | Admitting: Family Medicine

## 2022-08-06 VITALS — BP 132/82 | HR 84 | Temp 98.7°F | Ht 64.5 in | Wt 258.0 lb

## 2022-08-06 DIAGNOSIS — I10 Essential (primary) hypertension: Secondary | ICD-10-CM

## 2022-08-06 DIAGNOSIS — Z1211 Encounter for screening for malignant neoplasm of colon: Secondary | ICD-10-CM

## 2022-08-06 NOTE — Progress Notes (Signed)
Chief Complaint  Patient presents with   Follow-up    Subjective Kaitlyn Hancock is a 52 y.o. female who presents for hypertension follow up. She does not monitor home blood pressures. She is compliant with medications- Norvasc 5 mg/d, Benicar 20 mg/d. Patient has these side effects of medication: none She is now adhering to a healthy diet overall. Current exercise: none No Cp or SOB.    Past Medical History:  Diagnosis Date   Eczema 2014   History of chicken pox     Exam BP 132/82 (BP Location: Left Arm, Cuff Size: Large)   Pulse 84   Temp 98.7 F (37.1 C) (Oral)   Ht 5' 4.5" (1.638 m)   Wt 258 lb (117 kg)   SpO2 94%   BMI 43.60 kg/m  General:  well developed, well nourished, in no apparent distress Heart: RRR, no bruits, no LE edema Lungs: clear to auscultation, no accessory muscle use Psych: well oriented with normal range of affect and appropriate judgment/insight  Essential hypertension  Screen for colon cancer - Plan: Ambulatory referral to Gastroenterology  Chronic, stable. Cont Norvasc 5 mg/d, Benicar 20 mg/d. Counseled on diet and exercise. Monitor BP at home nad inform us if <110/70. GYN info provided. Refer to GI for CCS.  F/u in 6 mo for CPE. The patient voiced understanding and agreement to the plan.  [place], DO 08/06/22  8:26 AM

## 2022-08-06 NOTE — Patient Instructions (Addendum)
Keep the diet clean and stay active.  Call Center for Kaitlyn Hancock at [facility] at [phone] for an appointment.  They are located at [address], [address], [place], [place], [address] (right across the hall from our office).  If you do not hear anything about your referral in the next 1-2 weeks, call our office and ask for an update.  Continue to monitor blood pressure at home. If <110/70 consistently, let me know.   Let us know if you need anything.

## 2022-08-09 ENCOUNTER — Ambulatory Visit: Payer: No Typology Code available for payment source | Admitting: Neurology

## 2022-08-09 ENCOUNTER — Encounter: Payer: Self-pay | Admitting: Neurology

## 2022-08-09 VITALS — BP 159/87 | HR 83 | Ht 65.5 in | Wt 258.0 lb

## 2022-08-09 DIAGNOSIS — R635 Abnormal weight gain: Secondary | ICD-10-CM

## 2022-08-09 DIAGNOSIS — R0683 Snoring: Secondary | ICD-10-CM | POA: Diagnosis not present

## 2022-08-09 DIAGNOSIS — Z9189 Other specified personal risk factors, not elsewhere classified: Secondary | ICD-10-CM

## 2022-08-09 DIAGNOSIS — R0681 Apnea, not elsewhere classified: Secondary | ICD-10-CM

## 2022-08-09 DIAGNOSIS — Z820 Family history of epilepsy and other diseases of the nervous system: Secondary | ICD-10-CM

## 2022-08-09 DIAGNOSIS — G4719 Other hypersomnia: Secondary | ICD-10-CM | POA: Diagnosis not present

## 2022-08-09 DIAGNOSIS — R351 Nocturia: Secondary | ICD-10-CM

## 2022-08-09 NOTE — Progress Notes (Signed)
Subjective:    Patient ID: Kaitlyn Hancock is a 52 y.o. female.  HPI    Star Age, MD, PhD [facility] [address], [address] [place], [place] [address]  Dear Dr. Nani Ravens,  I saw your patient, Kaitlyn Hancock, upon your kind request in my sleep clinic today for initial consultation of her sleep disorder, in particular, concern for underlying obstructive sleep apnea.  The patient is unaccompanied today.  As you know, Kaitlyn Hancock is a 52 year old female with an underlying medical history of hypertension, eczema, and severe obesity with a BMI of over 40, who reports snoring and excessive daytime somnolence, as well as witnessed apneas per husband's report.  Her tiredness has been progressively worsening over the past year or so.  She has gained weight over time, particularly since the pandemic.  She is working on weight loss and in the past couple of months has lost about 12 pounds.  Her Epworth sleepiness score is 2 out of 24, fatigue severity score is 46 out of 63.  I reviewed your office note from 06/29/2022.  She lives with her husband and younger son who is a Equities trader in high school.  She has a son in college, he is a Paramedic at [employer].  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  She works some from home and 7 the office.  Bedtime is generally between 11 PM and 1 AM and rise time around 6:30 AM.  She has nocturia at least twice per average night and denies recurrent nocturnal or morning headaches.  Her mom has sleep apnea and has a CPAP machine.  Patient drinks limited caffeine in the form of coffee in the morning, usually 1 cup, no alcohol, she is a non-smoker.  Blood pressure has been elevated, she started blood pressure medication a couple of months ago and her blood pressure responded really well, she forgot to take her medication this morning.  Her Past Medical History Is Significant For: Past Medical History:  Diagnosis Date   Eczema 2014    History of chicken pox    Hypertension    Osteoarthritis     Her Past Surgical History Is Significant For: Past Surgical History:  Procedure Laterality Date   CHOLECYSTECTOMY  2009   COLONOSCOPY  2009    Her Family History Is Significant For: Family History  Problem Relation Age of Onset   Breast cancer Mother        uterine   Arthritis Mother        Living   Heart attack Father        Living   Arthritis Brother    Healthy Brother    Aneurysm Maternal Grandmother    Healthy Son    Eczema Son    Aneurysm Maternal Aunt     Her Social History Is Significant For: Social History   Socioeconomic History   Marital status: Married    Spouse name: Not on file   Number of children: Not on file   Years of education: Not on file   Highest education level: Not on file  Occupational History   Not on file  Tobacco Use   Smoking status: Never   Smokeless tobacco: Never  Vaping Use   Vaping Use: Never used  Substance and Sexual Activity   Alcohol use: No   Drug use: Never   Sexual activity: Not on file  Other Topics Concern   Not on file  Social History  Narrative   Caffiene 1 cup am and occasional another.  No soda   Work:  Geneticist, molecular   Social Determinants of Radio broadcast assistant Strain: Not on file  Food Insecurity: Not on file  Transportation Needs: Not on file  Physical Activity: Not on file  Stress: Not on file  Social Connections: Not on file    Her Allergies Are:  Allergies  Allergen Reactions   Adhesive [Tape] Rash    Eczema Rash  :   Her Current Medications Are:  Outpatient Encounter Medications as of 08/09/2022  Medication Sig   amLODipine (NORVASC) 5 MG tablet Take 1 tablet (5 mg total) by mouth daily.   Dupilumab 300 MG/2ML SOPN Inject into the skin.   fluticasone (FLONASE) 50 MCG/ACT nasal spray Place 2 sprays into both nostrils daily.   olmesartan (BENICAR) 20 MG tablet Take 1 tablet (20 mg total) by mouth daily.   No  facility-administered encounter medications on file as of 08/09/2022.  :   Review of Systems:  Out of a complete 14 point review of systems, all are reviewed and negative with the exception of these symptoms as listed below:  Review of Systems  Neurological:        Snores, witnessed apnea. Tired all the time.  ESS 2, FSS 46.     Objective:  Neurological Exam  Physical Exam Physical Examination:   Vitals:   08/09/22 0920 08/09/22 0936  BP: (!) 150/99 (!) 159/87  Pulse: 79 83    General Examination: The patient is a very pleasant 52 y.o. female in no acute distress. She appears well-developed and well-nourished and well groomed.   HEENT: Normocephalic, atraumatic, pupils are equal, round and reactive to light, extraocular tracking is good without limitation to gaze excursion or nystagmus noted. Hearing is grossly intact. Face is symmetric with normal facial animation. Speech is clear with no dysarthria noted. There is no hypophonia. There is no lip, neck/head, jaw or voice tremor. Neck is supple with full range of passive and active motion. There are no carotid bruits on auscultation. Oropharynx exam reveals: mild mouth dryness, good dental hygiene and moderate airway crowding, due to smaller airway entry, tonsils of 2-3+ on the R and 2+ on the L. Mallampati is class II. Tongue protrudes centrally and palate elevates symmetrically. Neck size is 16.75 inches. She has no overbite.   Chest: Clear to auscultation without wheezing, rhonchi or crackles noted.  Heart: S1+S2+0, regular and normal without murmurs, rubs or gallops noted.   Abdomen: Soft, non-tender and non-distended.  Extremities: There is trace pitting edema in the distal lower extremities bilaterally.   Skin: Warm and dry with evidence of eczematous changes extensor aspects for distal arms and legs.    Musculoskeletal: exam reveals no obvious joint deformities.   Neurologically:  Mental status: The patient is awake,  alert and oriented in all 4 spheres. Her immediate and remote memory, attention, language skills and fund of knowledge are appropriate. There is no evidence of aphasia, agnosia, apraxia or anomia. Speech is clear with normal prosody and enunciation. Thought process is linear. Mood is normal and affect is normal.  Cranial nerves II - XII are as described above under HEENT exam.  Motor exam: Normal bulk, strength and tone is noted. There is no obvious action or resting tremor.  Fine motor skills and coordination: grossly intact.  Cerebellar testing: No dysmetria or intention tremor. There is no truncal or gait ataxia.  Sensory exam: intact  to light touch in the upper and lower extremities.  Gait, station and balance: She stands easily. No veering to one side is noted. No leaning to one side is noted. Posture is age-appropriate and stance is narrow based. Gait shows normal stride length and normal pace. No problems turning are noted.   Assessment and Plan:  In summary, Kaitlyn Hancock is a very pleasant 52 y.o.-year old female with an underlying medical history of hypertension, eczema, and severe obesity with a BMI of over 40, whose history and physical exam are concerning for sleep disordered breathing, supporting a current working diagnosis of unspecified sleep apnea, particularly obstructive sleep apnea (OSA). While a laboratory attended sleep study is typically considered "gold standard" for evaluation of sleep disordered breathing, we mutually agreed to proceed with a home sleep test at this time.  Her pretest probability for sleep apnea is quite high.   I had a long chat with the patient about my findings and the diagnosis of sleep apnea, particularly OSA, its prognosis and treatment options. We talked about medical/conservative treatments, surgical interventions and non-pharmacological approaches for symptom control. I explained, in particular, the risks and ramifications of untreated moderate to severe  OSA, especially with respect to developing cardiovascular disease down the road, including congestive heart failure (CHF), difficult to treat hypertension, cardiac arrhythmias (particularly A-fib), neurovascular complications including TIA, stroke and dementia. Even type 2 diabetes has, in part, been linked to untreated OSA. Symptoms of untreated OSA may include (but may not be limited to) daytime sleepiness, nocturia (i.e. frequent nighttime urination), memory problems, mood irritability and suboptimally controlled or worsening mood disorder such as depression and/or anxiety, lack of energy, lack of motivation, physical discomfort, as well as recurrent headaches, especially morning or nocturnal headaches. We talked about the importance of maintaining a healthy lifestyle and striving for healthy weight. I recommended a sleep study at this time. I outlined the differences between a laboratory attended sleep study which is considered more comprehensive and accurate over the option of a home sleep test (HST); the latter may lead to underestimation of sleep disordered breathing in some instances and does not help with diagnosing upper airway resistance syndrome and is not accurate enough to diagnose primary central sleep apnea typically. I outlined possible surgical and non-surgical treatment options of OSA, including the use of a positive airway pressure (PAP) device (i.e. CPAP, AutoPAP/APAP or BiPAP in certain circumstances), a custom-made dental device (aka oral appliance, which would require a referral to a specialist dentist or orthodontist typically, and is generally speaking not considered for patients with full dentures or edentulous state), upper airway surgical options, such as traditional UPPP (which is not considered a first-line treatment) or the Inspire device (hypoglossal nerve stimulator, which would involve a referral for consultation with an ENT surgeon, after careful selection, following inclusion  criteria - also not first-line treatment). I explained the PAP treatment option to the patient in detail, as this is generally considered first-line treatment.  The patient indicated that she would be willing to try PAP therapy, if the need arises. I explained the importance of being compliant with PAP treatment, not only for insurance purposes but primarily to improve patient's symptoms symptoms, and for the patient's long term health benefit, including to reduce Her cardiovascular risks longer-term.    We will pick up our discussion about the next steps and treatment options after testing.  We will keep her posted as to the test results by phone call and/or MyChart messaging where possible.  We will plan to follow-up in sleep clinic accordingly as well.  I answered all her questions today and the patient was in agreement.   I encouraged her to call with any interim questions, concerns, problems or updates or email [place] through [place].  Generally speaking, sleep test authorizations may take up to 2 weeks, sometimes less, sometimes longer, the patient is encouraged to get in touch with [place] if they do not hear back from the sleep lab staff directly within the next 2 weeks.  Thank you very much for allowing me to participate in the care of this nice patient. If I can be of any further assistance to you please do not hesitate to call me at [phone].  Sincerely,   Star Age, MD, PhD

## 2022-08-09 NOTE — Patient Instructions (Signed)

## 2022-08-23 ENCOUNTER — Telehealth: Payer: Self-pay | Admitting: Neurology

## 2022-08-23 NOTE — Telephone Encounter (Signed)
HST- [employer] no auth req.  Sent mychart

## 2022-08-29 ENCOUNTER — Ambulatory Visit: Payer: No Typology Code available for payment source | Admitting: Neurology

## 2022-08-29 DIAGNOSIS — G4719 Other hypersomnia: Secondary | ICD-10-CM

## 2022-08-29 DIAGNOSIS — Z820 Family history of epilepsy and other diseases of the nervous system: Secondary | ICD-10-CM

## 2022-08-29 DIAGNOSIS — R0683 Snoring: Secondary | ICD-10-CM

## 2022-08-29 DIAGNOSIS — R351 Nocturia: Secondary | ICD-10-CM

## 2022-08-29 DIAGNOSIS — G4733 Obstructive sleep apnea (adult) (pediatric): Secondary | ICD-10-CM | POA: Diagnosis not present

## 2022-08-29 DIAGNOSIS — R0681 Apnea, not elsewhere classified: Secondary | ICD-10-CM

## 2022-08-29 DIAGNOSIS — R635 Abnormal weight gain: Secondary | ICD-10-CM

## 2022-08-29 DIAGNOSIS — Z9189 Other specified personal risk factors, not elsewhere classified: Secondary | ICD-10-CM

## 2022-09-20 NOTE — Telephone Encounter (Signed)
I called the patient she did not pick up I left her a voicemail asking her if her husband was picking up the device or if she wanted [place] to mail the device to her again. I left her my direct number and I also informed her I sent her a Clinical cytogeneticist message.

## 2022-09-26 NOTE — Telephone Encounter (Signed)
LVM for patient to use device or she will be charged $300 to having not use device yet.

## 2022-10-03 ENCOUNTER — Other Ambulatory Visit: Payer: Self-pay | Admitting: Neurology

## 2022-10-03 DIAGNOSIS — G4733 Obstructive sleep apnea (adult) (pediatric): Secondary | ICD-10-CM

## 2022-10-03 NOTE — Procedures (Signed)
   [facility]  HOME SLEEP TEST (Watch PAT) REPORT  STUDY DATE: 09/29/2022  DOB: 1971-02-18  MRN: [id number]  ORDERING CLINICIAN: Huston Foley, MD, PhD   REFERRING CLINICIAN: Sharlene Dory, DO   CLINICAL INFORMATION/HISTORY: 52 year old female with an underlying medical history of hypertension, eczema, and severe obesity with a BMI of over 40, who reports snoring and excessive daytime somnolence, as well as witnessed apneas.   Epworth sleepiness score: 2/24.  BMI: 41.5 kg/m  FINDINGS:   Sleep Summary:   Total Recording Time (hours, min): 8 hours, 31 min  Total Sleep Time (hours, min):  7 hours, 25 min  Percent REM (%):    37.2%   Respiratory Indices:   Calculated pAHI (per hour):  22.7/hour         REM pAHI:    47.2/hour       NREM pAHI: 8.2/hour  Central pAHI: 0/hour  Oxygen Saturation Statistics:    Oxygen Saturation (%) Mean: 93%   Minimum oxygen saturation (%):                 83%   O2 Saturation Range (%): 83 - 98%    O2 Saturation (minutes) <=88%: 5.7 min  Pulse Rate Statistics:   Pulse Mean (bpm):    75/min    Pulse Range (56 - 97/min)   IMPRESSION: OSA (obstructive sleep apnea), moderate  RECOMMENDATION:  This home sleep test demonstrates moderate obstructive sleep apnea with a total AHI of 22.7/hour and O2 nadir of 83%.  Mild to moderate snoring was detected, at times in the louder range. Treatment with a positive airway pressure (PAP) device is recommended. The patient will be advised to proceed with an autoPAP titration/trial at home for now. A full night titration study may be considered to optimize treatment settings, monitor proper oxygen saturations and aid with improvement of tolerance and adherence, if needed down the road. Alternative treatment options may include a dental device through dentistry or orthodontics in selected patients or Inspire (hypoglossal nerve stimulator) in carefully selected patients  (meeting inclusion criteria).  Concomitant weight loss is recommended (where clinically appropriate). Please note that untreated obstructive sleep apnea may carry additional perioperative morbidity. Patients with significant obstructive sleep apnea should receive perioperative PAP therapy and the surgeons and particularly the anesthesiologist should be informed of the diagnosis and the severity of the sleep disordered breathing. The patient should be cautioned not to drive, work at heights, or operate dangerous or heavy equipment when tired or sleepy. Review and reiteration of good sleep hygiene measures should be pursued with any patient. Other causes of the patient's symptoms, including circadian rhythm disturbances, an underlying mood disorder, medication effect and/or an underlying medical problem cannot be ruled out based on this test. Clinical correlation is recommended.  The patient and her referring provider will be notified of the test results. The patient will be seen in follow up in sleep clinic at [facility].  I certify that I have reviewed the raw data recording prior to the issuance of this report in accordance with the standards of the American Academy of Sleep Medicine (AASM).  INTERPRETING PHYSICIAN:   Huston Foley, MD, PhD Medical Director, [facility]) Diplomat, ABPN (Neurology and Sleep)   [facility] [address], [address] [place], [place] [address] [phone]

## 2022-10-08 ENCOUNTER — Telehealth: Payer: Self-pay | Admitting: *Deleted

## 2022-10-08 ENCOUNTER — Encounter: Payer: Self-pay | Admitting: Family Medicine

## 2022-10-08 NOTE — Telephone Encounter (Signed)
-----   Message from Huston Foley, MD sent at 10/03/2022 12:32 PM EDT ----- Patient referred by PCP, seen by me on 08/09/22, patient had a HST on 09/29/2022.    Please call and notify the patient that the recent home sleep test showed obstructive sleep apnea in the moderate range. I recommend treatment in the form of autoPAP, which means, that we don't have to bring her in for a sleep study with CPAP, but will let her start using a so called autoPAP machine at home, which is a CPAP-like machine with self-adjusting pressures. We will send the order to a local DME company (of her choice, or as per insurance requirement). The DME representative will fit her with a mask, educate her on how to use the machine, how to put the mask on, etc. I have placed an order in the chart. Please send the order, talk to patient, send report to referring MD. We will need a FU in sleep clinic for 10 weeks post-PAP set up, please arrange that with me or one of our NPs. Also reinforce the need for compliance with treatment. Thanks,   Huston Foley, MD, PhD [facility])

## 2022-10-08 NOTE — Telephone Encounter (Signed)
I called pt. I advised pt that Dr. Frances Furbish reviewed their sleep study results and found that pt has moderate OSA. Dr. Frances Furbish recommends that pt start autopap. I reviewed PAP compliance expectations with the pt. Pt is agreeable to starting an auto-PAP. I advised pt that an order will be sent to a DME, [facility], and they will call the pt within about one week after they file with the pt's insurance. Astrid Drafts will show the pt how to use the machine, fit for masks, and troubleshoot the auto-PAP if needed. A follow up appt will be made for insurance purposes with provider when pt gets her machine.  Pt verbalized understanding of results. Pt had no questions at this time but was encouraged to call back if questions arise. I have sent the order to [facility]  and have received confirmation that they have received the order.

## 2022-10-09 NOTE — Telephone Encounter (Signed)
New, Doristine Mango, RN; Penni Homans; Marveen Reeks; Santina Evans Received, thank you!     Previous Messages    ----- Message ----- From: Guy Begin, RN Sent: 10/08/2022   3:52 PM EDT To: Henderson Newcomer; Kathyrn Sheriff; Santina Evans; * Subject: new auto pap user for Moderate OSA            Order in [facility] Female, 52 y.o., 06-Dec-1970 MRN: [id number] Phone: [phone]   She works for you all [employer].    Delmer Islam

## 2022-10-23 ENCOUNTER — Other Ambulatory Visit (HOSPITAL_BASED_OUTPATIENT_CLINIC_OR_DEPARTMENT_OTHER): Payer: Self-pay

## 2022-11-02 ENCOUNTER — Other Ambulatory Visit (HOSPITAL_BASED_OUTPATIENT_CLINIC_OR_DEPARTMENT_OTHER): Payer: Self-pay

## 2023-03-07 ENCOUNTER — Other Ambulatory Visit (HOSPITAL_BASED_OUTPATIENT_CLINIC_OR_DEPARTMENT_OTHER): Payer: Self-pay

## 2023-04-12 ENCOUNTER — Ambulatory Visit: Payer: No Typology Code available for payment source | Admitting: Family Medicine

## 2023-04-19 NOTE — Telephone Encounter (Signed)
Pt called to confirm appt for 12/24. Advised that CMA wanted to make sure she didn't need to be seen sooner since she is not feeling good. Pt is in [place] with her mom and won't be back until 12/21 so that would be the earliest she could come in. Advised her to call us if anything changes but we will leave her appt on 12/24.

## 2023-04-29 ENCOUNTER — Telehealth: Payer: Self-pay | Admitting: *Deleted

## 2023-04-29 NOTE — Telephone Encounter (Signed)
Pt has lab appt scheduled tomorrow but not lab orders.    From the look of it looks like she may need to be seen in office based on mychart message.

## 2023-04-30 ENCOUNTER — Other Ambulatory Visit: Payer: Self-pay | Admitting: Family Medicine

## 2023-04-30 ENCOUNTER — Other Ambulatory Visit: Payer: No Typology Code available for payment source

## 2023-04-30 ENCOUNTER — Other Ambulatory Visit (INDEPENDENT_AMBULATORY_CARE_PROVIDER_SITE_OTHER): Payer: Self-pay

## 2023-04-30 ENCOUNTER — Encounter: Payer: Self-pay | Admitting: Family Medicine

## 2023-04-30 ENCOUNTER — Ambulatory Visit (INDEPENDENT_AMBULATORY_CARE_PROVIDER_SITE_OTHER): Payer: No Typology Code available for payment source | Admitting: Family Medicine

## 2023-04-30 VITALS — BP 138/84 | HR 90 | Temp 98.0°F | Resp 16 | Ht 65.0 in | Wt 255.8 lb

## 2023-04-30 DIAGNOSIS — Z1211 Encounter for screening for malignant neoplasm of colon: Secondary | ICD-10-CM

## 2023-04-30 DIAGNOSIS — I10 Essential (primary) hypertension: Secondary | ICD-10-CM

## 2023-04-30 DIAGNOSIS — Z0000 Encounter for general adult medical examination without abnormal findings: Secondary | ICD-10-CM | POA: Diagnosis not present

## 2023-04-30 DIAGNOSIS — Z114 Encounter for screening for human immunodeficiency virus [HIV]: Secondary | ICD-10-CM | POA: Diagnosis not present

## 2023-04-30 DIAGNOSIS — Z23 Encounter for immunization: Secondary | ICD-10-CM

## 2023-04-30 LAB — COMPREHENSIVE METABOLIC PANEL
ALT: 70 U/L — ABNORMAL HIGH (ref 0–35)
AST: 37 U/L (ref 0–37)
Albumin: 4.2 g/dL (ref 3.5–5.2)
Alkaline Phosphatase: 218 U/L — ABNORMAL HIGH (ref 39–117)
BUN: 17 mg/dL (ref 6–23)
CO2: 29 meq/L (ref 19–32)
Calcium: 9.2 mg/dL (ref 8.4–10.5)
Chloride: 103 meq/L (ref 96–112)
Creatinine, Ser: 0.85 mg/dL (ref 0.40–1.20)
GFR: 77.89 mL/min (ref >60.00)
Glucose, Bld: 133 mg/dL — ABNORMAL HIGH (ref 70–99)
Potassium: 5 meq/L (ref 3.5–5.1)
Sodium: 142 meq/L (ref 135–145)
Total Bilirubin: 0.7 mg/dL (ref 0.2–1.2)
Total Protein: 7.4 g/dL (ref 6.0–8.3)

## 2023-04-30 LAB — CBC
HCT: 39.5 % (ref 36.0–46.0)
Hemoglobin: 12.8 g/dL (ref 12.0–15.0)
MCHC: 32.4 g/dL (ref 30.0–36.0)
MCV: 85.4 fL (ref 78.0–100.0)
Platelets: 335 10*3/uL (ref 150.0–400.0)
RBC: 4.62 Mil/uL (ref 3.87–5.11)
RDW: 13.8 % (ref 11.5–15.5)
WBC: 5.8 10*3/uL (ref 4.0–10.5)

## 2023-04-30 LAB — LIPID PANEL
Cholesterol: 188 mg/dL (ref 0–200)
HDL: 41.9 mg/dL (ref >39.00)
LDL Cholesterol: 126 mg/dL — ABNORMAL HIGH (ref 0–99)
NonHDL: 146.42
Total CHOL/HDL Ratio: 4
Triglycerides: 102 mg/dL (ref 0.0–149.0)
VLDL: 20.4 mg/dL (ref 0.0–40.0)

## 2023-04-30 LAB — HEMOGLOBIN A1C: Hgb A1c MFr Bld: 6.2 % (ref 4.6–6.5)

## 2023-04-30 MED ORDER — HYDROCHLOROTHIAZIDE 25 MG PO TABS: 25.0000 mg | ORAL_TABLET | Freq: Every day | ORAL | 3 refills | Status: DC

## 2023-04-30 NOTE — Telephone Encounter (Signed)
Labs drawn

## 2023-04-30 NOTE — Patient Instructions (Addendum)
Give [place] 2-3 business days to get the results of your labs back.   Keep the diet clean and stay active.  Please get me a copy of your advanced directive form at your convenience.   The Shingrix vaccine (for shingles) is a 2 shot series spaced 2-6 months apart. It can make people feel low energy, achy and almost like they have the flu for 48 hours after injection. 1/5 people can have nausea and/or vomiting. Please plan accordingly when deciding on when to get this shot. Call our office for a nurse visit appointment to get this. The second shot of the series is less severe regarding the side effects, but it still lasts 48 hours.   If you do not hear anything about your referral in the next 1-2 weeks, call our office and ask for an update.  Call Center for [facility] at [phone] for an appointment.  They are located at [address], [address], [place], [place], [address] (right across the hall from our office).  Let us know if you need anything.

## 2023-04-30 NOTE — Progress Notes (Signed)
Chief Complaint  Patient presents with   Fever    Discuss fever     [name] is here for a complete physical.   Her last physical was >1 year ago.  Current diet: in general, diet is fair. Current exercise: none. Weight is stable and she denies fatigue out of ordinary. Seatbelt? Yes Advanced directive? No  Health Maintenance Pap/HPV- No Mammogram- Yes Colon cancer screening-No Shingrix- No Tetanus- Yes Hep C screening- No HIV screening- No  Past Medical History:  Diagnosis Date   Eczema 2014   History of chicken pox    Hypertension    Osteoarthritis      Past Surgical History:  Procedure Laterality Date   CHOLECYSTECTOMY  2009   COLONOSCOPY  2009    Medications  Current Outpatient Medications on File Prior to Visit  Medication Sig Dispense Refill   amLODipine (NORVASC) 5 MG tablet Take 1 tablet (5 mg total) by mouth daily. 90 tablet 2   Dupilumab 300 MG/2ML SOPN Inject into the skin.     fluticasone (FLONASE) 50 MCG/ACT nasal spray Place 2 sprays into both nostrils daily. 16 g 6   Allergies Allergies  Allergen Reactions   Adhesive [Tape] Rash    Eczema Rash    Review of Systems: Constitutional:  no unexpected weight changes Eye:  no recent significant change in vision Ear/Nose/Mouth/Throat:  Ears:  no recent change in hearing Nose/Mouth/Throat:  no complaints of nasal congestion, no sore throat Cardiovascular: no chest pain Respiratory:  no shortness of breath Gastrointestinal:  no abdominal pain, no change in bowel habits GU:  Female: negative for dysuria or pelvic pain Musculoskeletal/Extremities:  no pain of the joints Integumentary (Skin/Breast):  no abnormal skin lesions reported Neurologic:  no headaches Endocrine:  denies fatigue  Exam BP 138/84 (BP Location: Left Arm, Cuff Size: Large)   Pulse 90   Temp 98 F (36.7 C) (Oral)   Resp 16   Ht 5\' 5"  (1.651 m)   Wt 255 lb 12.8 oz (116 kg)   SpO2 96%   BMI 42.57 kg/m  General:   well developed, well nourished, in no apparent distress Skin:  no significant moles, warts, or growths Head:  no masses, lesions, or tenderness Eyes:  pupils equal and round, sclera anicteric without injection Ears:  canals without lesions, TMs shiny without retraction, no obvious effusion, no erythema Nose:  nares patent, mucosa normal, and no drainage  Throat/Pharynx:  lips and gingiva without lesion; tongue and uvula midline; non-inflamed pharynx; no exudates or postnasal drainage Neck: neck supple without adenopathy, thyromegaly, or masses Lungs:  clear to auscultation, breath sounds equal bilaterally, no respiratory distress Cardio:  regular rate and rhythm, no LE edema Abdomen:  abdomen soft, nontender; bowel sounds normal; no masses or organomegaly Genital: Defer to GYN Musculoskeletal:  symmetrical muscle groups noted without atrophy or deformity Extremities:  no clubbing, cyanosis, or edema, no deformities, no skin discoloration Neuro:  gait normal; deep tendon reflexes normal and symmetric Psych: well oriented with normal range of affect and appropriate judgment/insight  Assessment and Plan  Well adult exam - Plan: Hepatitis C antibody, HIV antibody (with reflex)  Essential hypertension  Screen for colon cancer - Plan: Ambulatory referral to Gastroenterology  Need for influenza vaccination - Plan: Flu vaccine trivalent PF, 6mos and older(Flulaval,Afluria,Fluarix,Fluzone)  Encounter for screening for HIV - Plan: HIV antibody (with reflex)   [name]. Counseled on diet and exercise. Advanced directive form provided today.  Flu  shot today.  GYN info provided. Refer GI again.  Shingrix rec'd.  Fever/acute illness resolved.  Other orders as above. Follow up in 6 mo. The patient voiced understanding and agreement to the plan.  Jilda Roche [place], DO 04/30/23 9:50 AM

## 2023-05-01 ENCOUNTER — Encounter: Payer: Self-pay | Admitting: Family Medicine

## 2023-05-01 LAB — HEPATITIS C ANTIBODY: Hepatitis C Ab: NONREACTIVE

## 2023-05-01 LAB — HIV ANTIBODY (ROUTINE TESTING W REFLEX): HIV 1&2 Ab, 4th Generation: NONREACTIVE

## 2023-05-02 ENCOUNTER — Other Ambulatory Visit: Payer: Self-pay | Admitting: *Deleted

## 2023-05-02 DIAGNOSIS — R748 Abnormal levels of other serum enzymes: Secondary | ICD-10-CM

## 2023-05-02 DIAGNOSIS — E7800 Pure hypercholesterolemia, unspecified: Secondary | ICD-10-CM

## 2023-05-16 ENCOUNTER — Other Ambulatory Visit (HOSPITAL_BASED_OUTPATIENT_CLINIC_OR_DEPARTMENT_OTHER): Payer: Self-pay

## 2023-05-31 ENCOUNTER — Other Ambulatory Visit: Payer: Self-pay | Admitting: Family Medicine

## 2023-05-31 ENCOUNTER — Other Ambulatory Visit (INDEPENDENT_AMBULATORY_CARE_PROVIDER_SITE_OTHER): Payer: No Typology Code available for payment source

## 2023-05-31 ENCOUNTER — Encounter: Payer: Self-pay | Admitting: Family Medicine

## 2023-05-31 DIAGNOSIS — E7800 Pure hypercholesterolemia, unspecified: Secondary | ICD-10-CM | POA: Diagnosis not present

## 2023-05-31 DIAGNOSIS — R748 Abnormal levels of other serum enzymes: Secondary | ICD-10-CM | POA: Diagnosis not present

## 2023-05-31 DIAGNOSIS — R7401 Elevation of levels of liver transaminase levels: Secondary | ICD-10-CM

## 2023-05-31 LAB — HEPATIC FUNCTION PANEL
ALT: 46 U/L — ABNORMAL HIGH (ref 0–35)
AST: 28 U/L (ref 0–37)
Albumin: 4.1 g/dL (ref 3.5–5.2)
Alkaline Phosphatase: 161 U/L — ABNORMAL HIGH (ref 39–117)
Bilirubin, Direct: 0.1 mg/dL (ref 0.0–0.3)
Total Bilirubin: 0.6 mg/dL (ref 0.2–1.2)
Total Protein: 7.2 g/dL (ref 6.0–8.3)

## 2023-05-31 LAB — LIPID PANEL
Cholesterol: 197 mg/dL (ref 0–200)
HDL: 38.9 mg/dL — ABNORMAL LOW (ref >39.00)
LDL Cholesterol: 127 mg/dL — ABNORMAL HIGH (ref 0–99)
NonHDL: 158.05
Total CHOL/HDL Ratio: 5
Triglycerides: 153 mg/dL — ABNORMAL HIGH (ref 0.0–149.0)
VLDL: 30.6 mg/dL (ref 0.0–40.0)

## 2023-06-12 ENCOUNTER — Ambulatory Visit (HOSPITAL_BASED_OUTPATIENT_CLINIC_OR_DEPARTMENT_OTHER): Payer: No Typology Code available for payment source

## 2023-06-13 ENCOUNTER — Ambulatory Visit (HOSPITAL_BASED_OUTPATIENT_CLINIC_OR_DEPARTMENT_OTHER)
Admission: RE | Admit: 2023-06-13 | Discharge: 2023-06-13 | Disposition: A | Discharge status: Home / Self Care | Payer: No Typology Code available for payment source | Source: Ambulatory Visit | Attending: Family Medicine | Admitting: Family Medicine

## 2023-06-13 DIAGNOSIS — R7401 Elevation of levels of liver transaminase levels: Secondary | ICD-10-CM | POA: Diagnosis present

## 2023-06-14 ENCOUNTER — Encounter: Payer: Self-pay | Admitting: Family Medicine

## 2023-06-25 ENCOUNTER — Encounter: Payer: Self-pay | Admitting: Gastroenterology

## 2023-07-12 ENCOUNTER — Ambulatory Visit (AMBULATORY_SURGERY_CENTER): Payer: No Typology Code available for payment source | Admitting: *Deleted

## 2023-07-12 VITALS — Ht 65.0 in | Wt 250.0 lb

## 2023-07-12 DIAGNOSIS — Z1211 Encounter for screening for malignant neoplasm of colon: Secondary | ICD-10-CM

## 2023-07-12 MED ORDER — SUFLAVE 178.7 G PO SOLR: 1.0000 | Freq: Once | ORAL | 0 refills | Status: AC

## 2023-07-12 NOTE — Progress Notes (Signed)
 Pt's name and DOB verified at the beginning of the pre-visit wit 2 identifiers  Pt denies any difficulty with ambulating,sitting, laying down or rolling side to side  Pt has no issues with ambulation   Pt has no issues moving head neck or swallowing  No egg or soy allergy known to patient   No issues known to pt with past sedation with any surgeries or procedures  Pt denies having issues being intubated  No FH of Malignant Hyperthermia  Pt is not on diet pills or shots  Pt is not on home 02   Pt is not on blood thinners   Pt denies issues with constipation   Pt is not on dialysis  Pt denise any abnormal heart rhythms   Pt denies any upcoming cardiac testing  Patient's chart reviewed by Cathlyn Parsons CNRA prior to pre-visit and patient appropriate for the [facility].  Pre-visit completed and red dot placed by patient's name on their procedure day (on provider's schedule).     Visit by phone  Pt states weight is 250 lb   IInstructions reviewed. Pt given [facility], [facility] main # and MD on call # prior to instructions.  Pt states understanding of instructions. Instructed to review again prior to procedure. Pt states they will.   Informed pt that they will receive a text or  call from [facility] regarding there prep med.

## 2023-08-02 ENCOUNTER — Other Ambulatory Visit (HOSPITAL_BASED_OUTPATIENT_CLINIC_OR_DEPARTMENT_OTHER): Payer: Self-pay

## 2023-08-02 ENCOUNTER — Encounter (HOSPITAL_BASED_OUTPATIENT_CLINIC_OR_DEPARTMENT_OTHER): Payer: Self-pay

## 2023-08-09 ENCOUNTER — Encounter: Payer: No Typology Code available for payment source | Admitting: Gastroenterology

## 2023-09-06 ENCOUNTER — Other Ambulatory Visit: Payer: Self-pay | Admitting: Family Medicine

## 2023-09-09 ENCOUNTER — Encounter: Payer: Self-pay | Admitting: Gastroenterology

## 2023-09-12 ENCOUNTER — Encounter (HOSPITAL_COMMUNITY): Payer: Self-pay

## 2023-09-19 ENCOUNTER — Encounter: Admitting: Gastroenterology

## 2023-09-19 ENCOUNTER — Telehealth: Payer: Self-pay | Admitting: Gastroenterology

## 2023-09-19 NOTE — Telephone Encounter (Signed)
 Hi Dr Cherryl Corona,   I called patient regarding her procedure, no answer, I left patient a voice message for patient to call us  back if she would like to reschedule. I will no show her for today's procedure.   Thank you

## 2023-09-22 ENCOUNTER — Other Ambulatory Visit: Payer: Self-pay | Admitting: Family Medicine

## 2023-09-22 DIAGNOSIS — I10 Essential (primary) hypertension: Secondary | ICD-10-CM

## 2023-09-23 ENCOUNTER — Other Ambulatory Visit (HOSPITAL_BASED_OUTPATIENT_CLINIC_OR_DEPARTMENT_OTHER): Payer: Self-pay

## 2023-09-23 MED ORDER — AMLODIPINE BESYLATE 5 MG PO TABS
5.0000 mg | ORAL_TABLET | Freq: Every day | ORAL | 2 refills | Status: DC
Start: 2023-09-23 — End: 2024-03-09
  Filled 2023-09-23 – 2023-10-29 (×4): qty 90, 90d supply, fill #0
  Filled 2024-02-11: qty 90, 90d supply, fill #1

## 2023-10-28 ENCOUNTER — Other Ambulatory Visit (HOSPITAL_COMMUNITY): Payer: Self-pay

## 2023-10-29 ENCOUNTER — Other Ambulatory Visit (HOSPITAL_COMMUNITY): Payer: Self-pay

## 2023-10-29 ENCOUNTER — Encounter: Payer: Self-pay | Admitting: Pharmacist

## 2023-10-29 ENCOUNTER — Other Ambulatory Visit: Payer: Self-pay

## 2023-10-29 ENCOUNTER — Ambulatory Visit: Payer: No Typology Code available for payment source | Admitting: Family Medicine

## 2024-02-11 ENCOUNTER — Other Ambulatory Visit (HOSPITAL_BASED_OUTPATIENT_CLINIC_OR_DEPARTMENT_OTHER): Payer: Self-pay

## 2024-02-11 ENCOUNTER — Other Ambulatory Visit (HOSPITAL_COMMUNITY): Payer: Self-pay

## 2024-02-12 ENCOUNTER — Other Ambulatory Visit: Payer: Self-pay

## 2024-03-09 ENCOUNTER — Other Ambulatory Visit: Payer: Self-pay

## 2024-03-09 DIAGNOSIS — I10 Essential (primary) hypertension: Secondary | ICD-10-CM

## 2024-03-09 MED ORDER — HYDROCHLOROTHIAZIDE 25 MG PO TABS: 25.0000 mg | ORAL_TABLET | Freq: Every day | ORAL | 3 refills | Status: AC

## 2024-03-09 MED ORDER — AMLODIPINE BESYLATE 5 MG PO TABS: 5.0000 mg | ORAL_TABLET | Freq: Every day | ORAL | 2 refills | Status: AC
